# Patient Record
Sex: Female | Born: 1950 | Race: Black or African American | Hispanic: No | Smoking: Never smoker
Health system: Southern US, Community
[De-identification: ages and names within clinical notes are randomized; demographics above are authoritative.]

## PROBLEM LIST (undated history)

## (undated) ENCOUNTER — Ambulatory Visit
Payer: MEDICARE | Attending: Student in an Organized Health Care Education/Training Program | Primary: Student in an Organized Health Care Education/Training Program

## (undated) ENCOUNTER — Ambulatory Visit

## (undated) ENCOUNTER — Ambulatory Visit: Payer: MEDICARE

## (undated) ENCOUNTER — Encounter

## (undated) ENCOUNTER — Telehealth

## (undated) ENCOUNTER — Ambulatory Visit: Payer: MEDICARE | Attending: Adult Health | Primary: Adult Health

## (undated) ENCOUNTER — Telehealth
Attending: Student in an Organized Health Care Education/Training Program | Primary: Student in an Organized Health Care Education/Training Program

## (undated) ENCOUNTER — Encounter
Attending: Student in an Organized Health Care Education/Training Program | Primary: Student in an Organized Health Care Education/Training Program

## (undated) ENCOUNTER — Non-Acute Institutional Stay: Payer: MEDICARE

## (undated) ENCOUNTER — Ambulatory Visit: Attending: Clinical | Primary: Clinical

## (undated) ENCOUNTER — Ambulatory Visit: Payer: Medicare (Managed Care)

## (undated) ENCOUNTER — Encounter
Payer: MEDICARE | Attending: Student in an Organized Health Care Education/Training Program | Primary: Student in an Organized Health Care Education/Training Program

## (undated) ENCOUNTER — Inpatient Hospital Stay: Payer: Medicare (Managed Care)

## (undated) ENCOUNTER — Telehealth: Attending: Family Medicine | Primary: Family Medicine

## (undated) ENCOUNTER — Encounter: Attending: Clinical | Primary: Clinical

## (undated) ENCOUNTER — Other Ambulatory Visit: Attending: Clinical | Primary: Clinical

## (undated) ENCOUNTER — Ambulatory Visit: Payer: MEDICARE | Attending: Pharmacist | Primary: Pharmacist

## (undated) ENCOUNTER — Encounter: Attending: Diagnostic Radiology | Primary: Diagnostic Radiology

## (undated) ENCOUNTER — Encounter: Attending: Family Medicine | Primary: Family Medicine

## (undated) ENCOUNTER — Inpatient Hospital Stay

## (undated) DIAGNOSIS — F0390 Unspecified dementia without behavioral disturbance: Secondary | ICD-10-CM

## (undated) DIAGNOSIS — I10 Essential (primary) hypertension: Secondary | ICD-10-CM

---

## 2015-09-05 ENCOUNTER — Emergency Department: Payer: Self-pay

## 2015-09-05 ENCOUNTER — Encounter: Payer: Self-pay | Admitting: *Deleted

## 2015-09-05 ENCOUNTER — Emergency Department
Admission: EM | Admit: 2015-09-05 | Discharge: 2015-09-05 | Disposition: A | Discharge status: Home / Self Care | Payer: Self-pay | Attending: Emergency Medicine | Admitting: Emergency Medicine

## 2015-09-05 DIAGNOSIS — R252 Cramp and spasm: Secondary | ICD-10-CM

## 2015-09-05 DIAGNOSIS — E876 Hypokalemia: Secondary | ICD-10-CM

## 2015-09-05 DIAGNOSIS — R600 Localized edema: Secondary | ICD-10-CM | POA: Insufficient documentation

## 2015-09-05 LAB — CBC
HCT: 37.3 % (ref 35.0–47.0)
Hemoglobin: 12.6 g/dL (ref 12.0–16.0)
MCH: 28.2 pg (ref 26.0–34.0)
MCHC: 33.8 g/dL (ref 32.0–36.0)
MCV: 83.4 fL (ref 80.0–100.0)
Platelets: 361 10*3/uL (ref 150–440)
RBC: 4.47 MIL/uL (ref 3.80–5.20)
RDW: 14.9 % — AB (ref 11.5–14.5)
WBC: 8.4 10*3/uL (ref 3.6–11.0)

## 2015-09-05 LAB — BASIC METABOLIC PANEL
Anion gap: 6 (ref 5–15)
BUN: 8 mg/dL (ref 6–20)
CALCIUM: 9 mg/dL (ref 8.9–10.3)
CO2: 23 mmol/L (ref 22–32)
CREATININE: 0.75 mg/dL (ref 0.44–1.00)
Chloride: 110 mmol/L (ref 101–111)
GFR calc Af Amer: >60 mL/min (ref >60)
GLUCOSE: 130 mg/dL — AB (ref 65–99)
Potassium: 3.3 mmol/L — ABNORMAL LOW (ref 3.5–5.1)
Sodium: 139 mmol/L (ref 135–145)

## 2015-09-05 LAB — BRAIN NATRIURETIC PEPTIDE: B NATRIURETIC PEPTIDE 5: 8 pg/mL (ref 0.0–100.0)

## 2015-09-05 LAB — CK: CK TOTAL: 102 U/L (ref 38–234)

## 2015-09-05 MED ORDER — DIAZEPAM 2 MG PO TABS: 2.0000 mg | ORAL_TABLET | Freq: Three times a day (TID) | ORAL | Status: DC | PRN

## 2015-09-05 MED ORDER — POTASSIUM CHLORIDE CRYS ER 20 MEQ PO TBCR
40.0000 meq | EXTENDED_RELEASE_TABLET | Freq: Once | ORAL | Status: AC
  Administered 2015-09-05: 40 meq via ORAL
  Filled 2015-09-05: qty 2

## 2015-09-05 MED ORDER — DIAZEPAM 5 MG/ML IJ SOLN
1.0000 mg | Freq: Once | INTRAMUSCULAR | Status: AC
  Administered 2015-09-05: 1 mg via INTRAVENOUS
  Filled 2015-09-05: qty 2

## 2015-09-05 NOTE — ED Provider Notes (Signed)
[facility] Emergency Department Provider Note   ____________________________________________  Time seen: Approximately 2:21 AM  I have reviewed the triage vital signs and the nursing notes.   HISTORY  Chief Complaint Leg Pain    HPI Alyssa Santiago is a 65 y.o. female who presents to the ED from home via EMS with a chief complaint of left lower leg cramps and pain. Patient had a recent history of hypokalemia for which she took a 60 day supply of potassium supplements. Last supplementation was 3 weeks ago. Denies use of diuretics. States her doctor was evaluating her for left lower leg edema but etiology of okay leaning is unclear. She is also noted a 3 week history of swelling to her left lower leg. Swelling is not painful. This morning she awoke from sleep with cramping in her left calf and lateral lower leg. Denies recent fever, chills, chest pain, shortness of breath, abdominal pain, nausea, vomiting, diarrhea. Denies recent travel or trauma. Nothing makes her symptoms better or worse.   Past medical history Hypokalemia  There are no active problems to display for this patient.   No past surgical history on file.  No current outpatient prescriptions on file.  Allergies Review of patient's allergies indicates no known allergies.  No family history on file.  Social History Social History  Substance Use Topics  . Smoking status: Never Smoker   . Smokeless tobacco: Not on file  . Alcohol Use: No    Review of Systems  Constitutional: No fever/chills. Eyes: No visual changes. ENT: No sore throat. Cardiovascular: Denies chest pain. Respiratory: Denies shortness of breath. Gastrointestinal: No abdominal pain.  No nausea, no vomiting.  No diarrhea.  No constipation. Genitourinary: Negative for dysuria. Musculoskeletal: Positive for left leg swelling and cramps. Negative for back pain. Skin: Negative for rash. Neurological: Negative for  headaches, focal weakness or numbness.  10-point ROS otherwise negative.  ____________________________________________   PHYSICAL EXAM:  VITAL SIGNS: ED Triage Vitals  Enc Vitals Group     BP 09/05/15 0039 143/85 mmHg     Pulse Rate 09/05/15 0035 105     Resp 09/05/15 0035 20     Temp 09/05/15 0035 98.1 F (36.7 C)     Temp src --      SpO2 09/05/15 0035 99 %     Weight 09/05/15 0035 222 lb (100.699 kg)     Height 09/05/15 0035 5\' 7"  (1.702 m)     Head Cir --      Peak Flow --      Pain Score --      Pain Loc --      Pain Edu? --      Excl. in [place]? --     Constitutional: Alert and oriented. Well appearing and in no acute distress. Eyes: Conjunctivae are normal. PERRL. EOMI. Head: Atraumatic. Nose: No congestion/rhinnorhea. Mouth/Throat: Mucous membranes are moist.  Oropharynx non-erythematous. Neck: No stridor.   Cardiovascular: Normal rate, regular rhythm. Grossly normal heart sounds.  Good peripheral circulation. Respiratory: Normal respiratory effort.  No retractions. Lungs CTAB. Gastrointestinal: Soft and nontender. No distention. No abdominal bruits. No CVA tenderness. Musculoskeletal:  LLE: Left calf measures 39 cm. Right calf measures 38 cm. Left calf and lateral leg mildly tender to palpation. 1+ nonpitting edema noted from foot to lower leg. Calf is supple without evidence for compartment syndrome. 2+ femoral, popliteal and distal pulses. Brisk, less than 5 second capillary refill.  No joint effusions. Neurologic:  Normal  speech and language. No gross focal neurologic deficits are appreciated. Skin:  Skin is warm, dry and intact. No rash noted. Psychiatric: Mood and affect are normal. Speech and behavior are normal.  ____________________________________________   LABS (all labs ordered are listed, but only abnormal results are displayed)  Labs Reviewed  BASIC METABOLIC PANEL - Abnormal; Notable for the following:    Potassium 3.3 (*)    Glucose, Bld 130 (*)     All other components within normal limits  CBC - Abnormal; Notable for the following:    RDW 14.9 (*)    All other components within normal limits  CK  BRAIN NATRIURETIC PEPTIDE   ____________________________________________  EKG  ED ECG REPORT I, SUNG,JADE J, the attending physician, personally viewed and interpreted this ECG.   Date: 09/05/2015  EKG Time: 0036  Rate: 104  Rhythm: sinus tachycardia  Axis: Normal  Intervals:none  ST&T Change: Nonspecific  ____________________________________________  RADIOLOGY  LLE Doppler Ultrasound: No evidence of deep venous thrombosis. ____________________________________________   PROCEDURES  Procedure(s) performed: None  Procedures  Critical Care performed: No  ____________________________________________   INITIAL IMPRESSION / ASSESSMENT AND PLAN / ED COURSE  Pertinent labs & imaging results that were available during my care of the patient were reviewed by me and considered in my medical decision making (see chart for details).  65 year old female who presents with LLE cramps and edema. Laboratory results remarkable for mild hypokalemia. Will replete, and obtain Doppler ultrasound of left leg to evaluate for DVT.  ----------------------------------------- 3:58 AM on 09/05/2015 -----------------------------------------  Updated patient of negative ultrasound results. Encourage patient to follow-up with her PCP for repeat potassium check. Prescribed limited quantity of Valium to use as needed for muscle cramps. Strict return precautions given. Patient and family verbalize understanding and agree with plan of care. ____________________________________________   FINAL CLINICAL IMPRESSION(S) / ED DIAGNOSES  Final diagnoses:  Hypokalemia  Cramps of left lower extremity      NEW MEDICATIONS STARTED DURING THIS VISIT:  New Prescriptions   No medications on file     Note:  This document was prepared using  Dragon voice recognition software and may include unintentional dictation errors.    Paulette Blanch, MD 09/05/15 [phone]

## 2015-09-05 NOTE — ED Notes (Signed)
Pt brought in via ems from home with leg cramps and pain while sleeping.  Pt states she finished 30 day supply of potassium 2 weeks ago.  No chest pain or sob.  No n/v/d.   Pt alert.  nsr on monitor.

## 2015-09-05 NOTE — ED Notes (Signed)
meds given.  Family with pt.  

## 2015-09-05 NOTE — ED Notes (Signed)
Pt reports she was asleep and woke up with leg cramps.  Finished potassium 2 weeks ago.  Pt reports pain in both legs.  Pt alert.

## 2015-09-05 NOTE — ED Notes (Signed)
Reviewed d/c instructions, follow-up care, and prescription with pt. Pt verbalized understanding 

## 2015-09-05 NOTE — Discharge Instructions (Signed)
1. You may take muscle relaxer as needed for leg cramps (Valium #15). 2. Please follow up with your doctor next week to recheck potassium level. 3. Return to the ER for worsening symptoms, persistent vomiting, difficulty breathing or other concerns.  Leg Cramps Leg cramps occur when a muscle or muscles tighten and you have no control over this tightening (involuntary muscle contraction). Muscle cramps can develop in any muscle, but the most common place is in the calf muscles of the leg. Those cramps can occur during exercise or when you are at rest. Leg cramps are painful, and they may last for a few seconds to a few minutes. Cramps may return several times before they finally stop. Usually, leg cramps are not caused by a serious medical problem. In many cases, the cause is not known. Some common causes include:  Overexertion.  Overuse from repetitive motions, or doing the same thing over and over.  Remaining in a certain position for a long period of time.  Improper preparation, form, or technique while performing a sport or an activity.  Dehydration.  Injury.  Side effects of some medicines.  Abnormally low levels of the salts and ions in your blood (electrolytes), especially potassium and calcium. These levels could be low if you are taking water pills (diuretics) or if you are pregnant. HOME CARE INSTRUCTIONS Watch your condition for any changes. Taking the following actions may help to lessen any discomfort that you are feeling:  Stay well-hydrated. Drink enough fluid to keep your urine clear or pale yellow.  Try massaging, stretching, and relaxing the affected muscle. Do this for several minutes at a time.  For tight or tense muscles, use a warm towel, heating pad, or hot shower water directed to the affected area.  If you are sore or have pain after a cramp, applying ice to the affected area may relieve discomfort.  Put ice in a plastic bag.  Place a towel between your  skin and the bag.  Leave the ice on for 20 minutes, 2-3 times per day.  Avoid strenuous exercise for several days if you have been having frequent leg cramps.  Make sure that your diet includes the essential minerals for your muscles to work normally.  Take medicines only as directed by your health care provider. SEEK MEDICAL CARE IF:  Your leg cramps get more severe or more frequent, or they do not improve over time.  Your foot becomes cold, numb, or blue.   This information is not intended to replace advice given to you by your health care provider. Make sure you discuss any questions you have with your health care provider.   Document Released: 03/28/2004 Document Revised: 07/05/2014 Document Reviewed: 01/26/2014 Elsevier Interactive Patient Education 2016 [employer].  Hypokalemia Hypokalemia means that the amount of potassium in the blood is lower than normal.Potassium is a chemical, called an electrolyte, that helps regulate the amount of fluid in the body. It also stimulates muscle contraction and helps nerves function properly.Most of the body's potassium is inside of cells, and only a very small amount is in the blood. Because the amount in the blood is so small, minor changes can be life-threatening. CAUSES  Antibiotics.  Diarrhea or vomiting.  Using laxatives too much, which can cause diarrhea.  Chronic kidney disease.  Water pills (diuretics).  Eating disorders (bulimia).  Low magnesium level.  Sweating a lot. SIGNS AND SYMPTOMS  Weakness.  Constipation.  Fatigue.  Muscle cramps.  Mental confusion.  Skipped  heartbeats or irregular heartbeat (palpitations).  Tingling or numbness. DIAGNOSIS  Your health care provider can diagnose hypokalemia with blood tests. In addition to checking your potassium level, your health care provider may also check other lab tests. TREATMENT Hypokalemia can be treated with potassium supplements taken by mouth or  adjustments in your current medicines. If your potassium level is very low, you may need to get potassium through a vein (IV) and be monitored in the hospital. A diet high in potassium is also helpful. Foods high in potassium are:  Nuts, such as peanuts and pistachios.  Seeds, such as sunflower seeds and pumpkin seeds.  Peas, lentils, and lima beans.  Whole grain and bran cereals and breads.  Fresh fruit and vegetables, such as apricots, avocado, bananas, cantaloupe, kiwi, oranges, tomatoes, asparagus, and potatoes.  Orange and tomato juices.  Red meats.  Fruit yogurt. HOME CARE INSTRUCTIONS  Take all medicines as prescribed by your health care provider.  Maintain a healthy diet by including nutritious food, such as fruits, vegetables, nuts, whole grains, and lean meats.  If you are taking a laxative, be sure to follow the directions on the label. SEEK MEDICAL CARE IF:  Your weakness gets worse.  You feel your heart pounding or racing.  You are vomiting or having diarrhea.  You are diabetic and having trouble keeping your blood glucose in the normal range. SEEK IMMEDIATE MEDICAL CARE IF:  You have chest pain, shortness of breath, or dizziness.  You are vomiting or having diarrhea for more than 2 days.  You faint. MAKE SURE YOU:   Understand these instructions.  Will watch your condition.  Will get help right away if you are not doing well or get worse.   This information is not intended to replace advice given to you by your health care provider. Make sure you discuss any questions you have with your health care provider.   Document Released: 02/18/2005 Document Revised: 03/11/2014 Document Reviewed: 08/21/2012 Elsevier Interactive Patient Education 2016 [place].  Potassium Content of Foods Potassium is a mineral found in many foods and drinks. It helps keep fluids and minerals balanced in your body and affects how steadily your heart beats. Potassium also  helps control your blood pressure and keep your muscles and nervous system healthy. Certain health conditions and medicines may change the balance of potassium in your body. When this happens, you can help balance your level of potassium through the foods that you do or do not eat. Your health care provider or dietitian may recommend an amount of potassium that you should have each day. The following lists of foods provide the amount of potassium (in parentheses) per serving in each item. HIGH IN POTASSIUM  The following foods and beverages have 200 mg or more of potassium per serving:  Apricots, 2 raw or 5 dry (200 mg).  Artichoke, 1 medium (345 mg).  Avocado, raw,  each (245 mg).  Banana, 1 medium (425 mg).  Beans, lima, or baked beans, canned,  cup (280 mg).  Beans, white, canned,  cup (595 mg).  Beef roast, 3 oz (320 mg).  Beef, ground, 3 oz (270 mg).  Beets, raw or cooked,  cup (260 mg).  Bran muffin, 2 oz (300 mg).  Broccoli,  cup (230 mg).  Brussels sprouts,  cup (250 mg).  Cantaloupe,  cup (215 mg).  Cereal, 100% bran,  cup (200-400 mg).  Cheeseburger, single, fast food, 1 each (225-400 mg).  Chicken, 3 oz (220 mg).  Clams, canned, 3 oz (535 mg).  Crab, 3 oz (225 mg).  Dates, 5 each (270 mg).  Dried beans and peas,  cup (300-475 mg).  Figs, dried, 2 each (260 mg).  Fish: halibut, tuna, cod, snapper, 3 oz (480 mg).  Fish: salmon, haddock, swordfish, perch, 3 oz (300 mg).  Fish, tuna, canned 3 oz (200 mg).  [place] fries, fast food, 3 oz (470 mg).  Granola with fruit and nuts,  cup (200 mg).  Grapefruit juice,  cup (200 mg).  Greens, beet,  cup (655 mg).  Honeydew melon,  cup (200 mg).  Kale, raw, 1 cup (300 mg).  Kiwi, 1 medium (240 mg).  Kohlrabi, rutabaga, parsnips,  cup (280 mg).  Lentils,  cup (365 mg).  Mango, 1 each (325 mg).  Milk, chocolate, 1 cup (420 mg).  Milk: nonfat, low-fat, whole, buttermilk, 1 cup  (350-380 mg).  Molasses, 1 Tbsp (295 mg).  Mushrooms,  cup (280) mg.  Nectarine, 1 each (275 mg).  Nuts: almonds, peanuts, hazelnuts, [place], cashew, mixed, 1 oz (200 mg).  Nuts, pistachios, 1 oz (295 mg).  Orange, 1 each (240 mg).  Orange juice,  cup (235 mg).  Papaya, medium,  fruit (390 mg).  Peanut butter, chunky, 2 Tbsp (240 mg).  Peanut butter, smooth, 2 Tbsp (210 mg).  Pear, 1 medium (200 mg).  Pomegranate, 1 whole (400 mg).  Pomegranate juice,  cup (215 mg).  Pork, 3 oz (350 mg).  Potato chips, salted, 1 oz (465 mg).  Potato, baked with skin, 1 medium (925 mg).  Potatoes, boiled,  cup (255 mg).  Potatoes, mashed,  cup (330 mg).  Prune juice,  cup (370 mg).  Prunes, 5 each (305 mg).  Pudding, chocolate,  cup (230 mg).  Pumpkin, canned,  cup (250 mg).  Raisins, seedless,  cup (270 mg).  Seeds, sunflower or pumpkin, 1 oz (240 mg).  Soy milk, 1 cup (300 mg).  Spinach,  cup (420 mg).  Spinach, canned,  cup (370 mg).  Sweet potato, baked with skin, 1 medium (450 mg).  Swiss chard,  cup (480 mg).  Tomato or vegetable juice,  cup (275 mg).  Tomato sauce or puree,  cup (400-550 mg).  Tomato, raw, 1 medium (290 mg).  Tomatoes, canned,  cup (200-300 mg).  [place], 3 oz (250 mg).  Wheat germ, 1 oz (250 mg).  Winter squash,  cup (250 mg).  Yogurt, plain or fruited, 6 oz (260-435 mg).  Zucchini,  cup (220 mg). MODERATE IN POTASSIUM The following foods and beverages have 50-200 mg of potassium per serving:  Apple, 1 each (150 mg).  Apple juice,  cup (150 mg).  Applesauce,  cup (90 mg).  Apricot nectar,  cup (140 mg).  Asparagus, small spears,  cup or 6 spears (155 mg).  Bagel, cinnamon raisin, 1 each (130 mg).  Bagel, egg or plain, 4 in., 1 each (70 mg).  Beans, green,  cup (90 mg).  Beans, yellow,  cup (190 mg).  Beer, regular, 12 oz (100 mg).  Beets, canned,  cup (125 mg).  Blackberries,  cup  (115 mg).  Blueberries,  cup (60 mg).  Bread, whole wheat, 1 slice (70 mg).  Broccoli, raw,  cup (145 mg).  Cabbage,  cup (150 mg).  Carrots, cooked or raw,  cup (180 mg).  Cauliflower, raw,  cup (150 mg).  Celery, raw,  cup (155 mg).  Cereal, bran flakes, cup (120-150 mg).  Cheese, cottage,  cup (110 mg).  Cherries, 10 each (150 mg).  Chocolate, 1 oz bar (165 mg).  Coffee, brewed 6 oz (90 mg).  Corn,  cup or 1 ear (195 mg).  Cucumbers,  cup (80 mg).  Egg, large, 1 each (60 mg).  Eggplant,  cup (60 mg).  Endive, raw, cup (80 mg).  English muffin, 1 each (65 mg).  Fish, orange roughy, 3 oz (150 mg).  Frankfurter, beef or pork, 1 each (75 mg).  Fruit cocktail,  cup (115 mg).  Grape juice,  cup (170 mg).  Grapefruit,  fruit (175 mg).  Grapes,  cup (155 mg).  Greens: kale, turnip, collard,  cup (110-150 mg).  Ice cream or frozen yogurt, chocolate,  cup (175 mg).  Ice cream or frozen yogurt, vanilla,  cup (120-150 mg).  Lemons, limes, 1 each (80 mg).  Lettuce, all types, 1 cup (100 mg).  Mixed vegetables,  cup (150 mg).  Mushrooms, raw,  cup (110 mg).  Nuts: walnuts, pecans, or macadamia, 1 oz (125 mg).  Oatmeal,  cup (80 mg).  Okra,  cup (110 mg).  Onions, raw,  cup (120 mg).  Peach, 1 each (185 mg).  Peaches, canned,  cup (120 mg).  Pears, canned,  cup (120 mg).  Peas, green, frozen,  cup (90 mg).  Peppers, green,  cup (130 mg).  Peppers, red,  cup (160 mg).  Pineapple juice,  cup (165 mg).  Pineapple, fresh or canned,  cup (100 mg).  Plums, 1 each (105 mg).  Pudding, vanilla,  cup (150 mg).  Raspberries,  cup (90 mg).  Rhubarb,  cup (115 mg).  Rice, wild,  cup (80 mg).  Shrimp, 3 oz (155 mg).  Spinach, raw, 1 cup (170 mg).  Strawberries,  cup (125 mg).  Summer squash  cup (175-200 mg).  Swiss chard, raw, 1 cup (135 mg).  Tangerines, 1 each (140 mg).  Tea, brewed, 6 oz (65  mg).  Turnips,  cup (140 mg).  Watermelon,  cup (85 mg).  Wine, red, table, 5 oz (180 mg).  Wine, white, table, 5 oz (100 mg). LOW IN POTASSIUM The following foods and beverages have less than 50 mg of potassium per serving.  Bread, white, 1 slice (30 mg).  Carbonated beverages, 12 oz (less than 5 mg).  Cheese, 1 oz (20-30 mg).  Cranberries,  cup (45 mg).  Cranberry juice cocktail,  cup (20 mg).  Fats and oils, 1 Tbsp (less than 5 mg).  Hummus, 1 Tbsp (32 mg).  Nectar: papaya, mango, or pear,  cup (35 mg).  Rice, white or brown,  cup (50 mg).  Spaghetti or macaroni,  cup cooked (30 mg).  Tortilla, flour or corn, 1 each (50 mg).  Waffle, 4 in., 1 each (50 mg).  Water chestnuts,  cup (40 mg).   This information is not intended to replace advice given to you by your health care provider. Make sure you discuss any questions you have with your health care provider.   Document Released: 10/02/2004 Document Revised: 02/23/2013 Document Reviewed: 01/15/2013 Elsevier Interactive Patient Education [employer].

## 2016-11-07 ENCOUNTER — Other Ambulatory Visit: Payer: Self-pay | Admitting: Pediatrics

## 2016-11-07 DIAGNOSIS — Z1231 Encounter for screening mammogram for malignant neoplasm of breast: Secondary | ICD-10-CM

## 2017-11-07 ENCOUNTER — Other Ambulatory Visit: Payer: Self-pay | Admitting: Neurology

## 2017-11-07 DIAGNOSIS — R413 Other amnesia: Secondary | ICD-10-CM

## 2017-11-21 ENCOUNTER — Ambulatory Visit
Admission: RE | Admit: 2017-11-21 | Discharge: 2017-11-21 | Disposition: A | Discharge status: Home / Self Care | Payer: Medicare Other | Source: Ambulatory Visit | Attending: Neurology | Admitting: Neurology

## 2017-11-21 DIAGNOSIS — G319 Degenerative disease of nervous system, unspecified: Secondary | ICD-10-CM | POA: Insufficient documentation

## 2017-11-21 DIAGNOSIS — R413 Other amnesia: Secondary | ICD-10-CM | POA: Insufficient documentation

## 2017-11-21 DIAGNOSIS — R9082 White matter disease, unspecified: Secondary | ICD-10-CM | POA: Diagnosis not present

## 2018-02-09 ENCOUNTER — Other Ambulatory Visit: Payer: Self-pay | Admitting: Neurology

## 2018-02-09 DIAGNOSIS — E237 Disorder of pituitary gland, unspecified: Secondary | ICD-10-CM

## 2018-02-18 ENCOUNTER — Ambulatory Visit: Payer: Medicare Other

## 2018-03-10 ENCOUNTER — Encounter (INDEPENDENT_AMBULATORY_CARE_PROVIDER_SITE_OTHER): Payer: Self-pay

## 2018-03-10 ENCOUNTER — Ambulatory Visit
Admission: RE | Admit: 2018-03-10 | Discharge: 2018-03-10 | Disposition: A | Discharge status: Home / Self Care | Payer: Medicare Other | Source: Ambulatory Visit | Attending: Neurology | Admitting: Neurology

## 2018-03-10 DIAGNOSIS — E237 Disorder of pituitary gland, unspecified: Secondary | ICD-10-CM | POA: Insufficient documentation

## 2018-03-10 LAB — POCT I-STAT CREATININE: Creatinine, Ser: 0.8 mg/dL (ref 0.44–1.00)

## 2018-03-10 MED ORDER — GADOBUTROL 1 MMOL/ML IV SOLN
7.5000 mL | Freq: Once | INTRAVENOUS | Status: AC | PRN
Start: 2018-03-10 — End: 2018-03-10
  Administered 2018-03-10: 7.5 mL via INTRAVENOUS

## 2018-05-15 ENCOUNTER — Ambulatory Visit: Payer: Medicare Other

## 2018-05-19 ENCOUNTER — Encounter: Payer: Medicare Other | Admitting: Physical Therapy

## 2018-05-20 ENCOUNTER — Ambulatory Visit: Payer: Medicare Other | Admitting: Physical Therapy

## 2018-08-14 ENCOUNTER — Other Ambulatory Visit: Payer: Self-pay

## 2018-08-14 ENCOUNTER — Ambulatory Visit: Payer: Medicare Other | Attending: Neurology

## 2018-08-14 DIAGNOSIS — R2681 Unsteadiness on feet: Secondary | ICD-10-CM | POA: Diagnosis present

## 2018-08-14 DIAGNOSIS — R42 Dizziness and giddiness: Secondary | ICD-10-CM | POA: Insufficient documentation

## 2018-08-14 NOTE — Therapy (Signed)
[place] MAIN [facility] SERVICES [address] [place], [place], [address] Phone: [phone]   Fax:  [phone]  Physical Therapy Evaluation  Patient Details  Name: Alyssa Santiago MRN: [id number] Date of Birth: 10/12/1950 Referring Provider (PT): Dr. Manuella Ghazi   Encounter Date: 08/14/2018  PT End of Session - 08/14/18 1014    Visit Number  1    Number of Visits  13    Date for PT Re-Evaluation  11/06/18    PT Start Time  8127    PT Stop Time  1015    PT Time Calculation (min)  80 min    Equipment Utilized During Treatment  Gait belt    Activity Tolerance  Patient tolerated treatment well    Behavior During Therapy  [facility] for tasks assessed/performed       History reviewed. No pertinent past medical history.  History reviewed. No pertinent surgical history.  Vitals:   08/14/18 0921 08/14/18 0922 08/14/18 0923  SpO2: 98% 99% 99%     Subjective Assessment - 08/14/18 0924    Subjective  Dizziness/weakness/unsteadiness    Pertinent History  Alyssa Santiago was referred for vestibular therapy due to complaints of dizziness. 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 Otherwise MRI negative for other acute changes. It appears the MRI was ordered during work-up of symptoms consisting of headache, dizziness, memory difficulty. She did have a laboratory evaluation which revealed normal findings for hormone production. She was also complaining of some blurry vision and recently saw ophthalmology 3 months and they did not comment on any visual field defects. She saw cardiology with a halter monitor which was only significant for some bradycardia in the 30's but this was attributed to normal variation during sleep. She also has been having daily headaches and was prescribed migraine medication by neurology. Pt complains of dizziness which started at some point in the last year. Pt  reports that she feels like she is losing her balance and her "eyes feel funny." She also complains of severe fatigue and is "dragging" when she wakes up. Pt states that she feels like she might pass out or black out but these symptoms have improved since she started taking the Fioricet for her headaches. She was prescribed Emgality which pt states was $1000/month so she was unable to fill. She did not fill any of there other migraine medications she was prescribed.    Limitations  Walking    Diagnostic tests  MRI: small pituitary tumor but otherwise no acute changes    Patient Stated Goals  "For exercising"    Currently in Pain?  No/denies         [facility] PT Assessment - 08/14/18 0925      Assessment   Medical Diagnosis  Dizziness    Referring Provider (PT)  Dr. Manuella Ghazi    Onset Date/Surgical Date  08/13/17   Approximate   Hand Dominance  Right    Next MD Visit  July 2020    Prior Therapy  "Yes 5-6 years ago" Pt doesn't recall why      Precautions   Precautions  Fall      Restrictions   Weight Bearing Restrictions  No      Balance Screen   Has the patient fallen in the past 6 months  No    Has the patient had a decrease in activity level because of a fear of falling?  Yes    Is the patient reluctant to leave their home because of a fear of falling?   Yes      [place] residence    Living Arrangements  Alone    Available Help at Discharge  Family    Type of [place] to enter    Entrance Stairs-Number of Steps  3    Entrance Stairs-Rails  Right    [place]  One level    [place]  None      Prior Function   Level of [place]  Retired    Leisure  "Nothing" Pt states that she feels down often      Cognition   Overall Cognitive Status  No family/caregiver present to determine baseline cognitive functioning      Observation/Other Assessments   Other Surveys   Activities of  Balance and Confidence Scale;Dizziness Handicap Inventory ([place])    Activities of Balance Confidence Scale (ABC Scale)   58.75%    Dizziness Handicap Inventory (DHI)   40/100      Standardized Balance Assessment   Standardized Balance Assessment  Berg Balance Test;Dynamic Gait Index;Timed Up and Go Test;Five Times Sit to Stand;10 meter walk test    Five times sit to stand comments   14.4s    10 Meter Walk  Self-selected: 9.7s = 1.51m/s; Fastest: 5.3s = 1.89 m/s      Berg Balance Test   Sit to Stand  Able to stand without using hands and stabilize independently    Standing Unsupported  Able to stand safely 2 minutes    Sitting with Back Unsupported but Feet Supported on Floor or Stool  Able to sit safely and securely 2 minutes    Stand to Sit  Sits safely with minimal use of hands    Transfers  Able to transfer safely, minor use of hands    Standing Unsupported with Eyes Closed  Able to stand 10 seconds safely    Standing Unsupported with Feet Together  Able to place feet together independently and stand 1 minute safely    From Standing, Reach Forward with Outstretched Arm  Can reach confidently >25 cm (10")    From Standing Position, Pick up Object from Floor  Able to pick up shoe safely and easily    From Standing Position, Turn to Look Behind Over each Shoulder  Looks behind from both sides and weight shifts well    Turn 360 Degrees  Able to turn 360 degrees safely in 4 seconds or less    Standing Unsupported, Alternately Place Feet on Step/Stool  Able to stand independently and safely and complete 8 steps in 20 seconds    Standing Unsupported, One Foot in Front  Able to place foot tandem independently and hold 30 seconds    Standing on One Leg  Able to lift leg independently and hold > 10 seconds    Total Score  56      Dynamic Gait Index   Level Surface  Normal    Change in Gait Speed  Normal    Gait with Horizontal Head Turns  Normal    Gait with Vertical Head Turns  Moderate  Impairment    Gait and Pivot Turn  Normal    Step Over Obstacle  Normal    Step Around Obstacles  Normal    Steps  Mild Impairment  Total Score  21      Timed Up and Go Test   TUG  Normal TUG    Normal TUG (seconds)  7              VESTIBULAR AND BALANCE EVALUATION   HISTORY:  Subjective history of current problem: Alyssa Santiago was referred for vestibular therapy due to complaints of dizziness. 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 Otherwise MRI negative for other acute changes. It appears the MRI was ordered during work-up of symptoms consisting of headache, dizziness, memory difficulty. She did have a laboratory evaluation which revealed normal findings for hormone production. She was also complaining of some blurry vision and recently saw ophthalmology 3 months and they did not comment on any visual field defects. She saw cardiology with a halter monitor which was only significant for some bradycardia in the 30's but this was attributed to normal variation during sleep. She also has been having daily headaches and was prescribed migraine medication by neurology. Pt complains of dizziness which started at some point in the last year. Pt reports that she feels like she is losing her balance and her "eyes feel funny." She also complains of severe fatigue and is "dragging" when she wakes up. Pt states that she feels like she might pass out or black out but these symptoms have improved since she started taking the Fioricet for her headaches. She was prescribed Emgality which pt states was $1000/month so she was unable to fill. She did not fill any of there other migraine medications she was prescribed.    Description of dizziness: (vertigo, unsteadiness, lightheadedness, falling, general unsteadiness, whoozy, swimmy-headed sensation, aural fullness) Unsteadiness, whoozy, lightheadedness Frequency: No more than  twice per day, worse in the morning  Duration: Less than 20 minutes to 3 hours Symptom nature: (motion provoked, positional, spontaneous, constant, variable, intermittent) Unknown  Provocative Factors: Unknown Easing Factors: Unknown  Progression of symptoms: (better, worse, no change since onset) Better History of similar episodes: None  Falls (yes/no): No Number of falls in past 6 months: No  Prior Functional Level: Independent  Auditory complaints (tinnitus, pain, drainage): Denies Vision (last eye exam, diplopia, recent changes): Bilateral blurry vision. Pt wears reading/driving glasses.   Red Flags: (dysarthria, dysphagia, drop attacks, bowel and bladder changes, recent weight loss/gain) Review of systems negative for red flags.     EXAMINATION  POSTURE:   NEUROLOGICAL SCREEN: (2+ unless otherwise noted.) N=normal  Ab=abnormal  Level Dermatome R L Myotome R L Reflex R L  C3 Anterior Neck N N Sidebend C2-3 N N Jaw CN V    C4 Top of Shoulder N N Shoulder Shrug C4 N N Hoffmans UMN    C5 Lateral Upper Arm N N Shoulder ABD C4-5 N N Biceps C5-6    C6 Lateral Arm/ Thumb N N Arm Flex/ Wrist Ext C5-6 N N Brachiorad. C5-6    C7 Middle Finger N N Arm Ext//Wrist Flex C6-7 N N Triceps C7    C8 4th & 5th Finger N N Flex/ Ext Carpi Ulnaris C8 N N Patellar (L3-4)    T1 Medial Arm N N Interossei T1 N N Gastrocnemius    L2 Medial thigh/groin N N Illiopsoas (L2-3) N N     L3 Lower thigh/med.knee N N Quadriceps (L3-4) N N     L4 Medial leg/lat thigh N N Tibialis Ant (L4-5) N N     L5  Lat. leg & dorsal foot N N EHL (L5) N N     S1 post/lat foot/thigh/leg N N Gastrocnemius (S1-2) N N     S2 Post./med. thigh & leg N N Hamstrings (L4-S3) N N       SOMATOSENSORY:         Sensation           Intact      Diminished         Absent  Light touch Normal      COORDINATION: Finger to Nose: Normal Heel to Shin: Normal  MUSCULOSKELETAL SCREEN: Cervical Spine ROM: WFL and painless in all  planes. No gross deficits identified   ROM: WFL  MMT: WFL  Functional Mobility: Independent with transfers and ambulation without assistive device    POSTURAL CONTROL TESTS:   Clinical Test of Sensory Interaction for Balance    (CTSIB):  CONDITION TIME STRATEGY SWAY  Eyes open, firm surface 30 seconds ankle 1+  Eyes closed, firm surface 30 seconds ankle 2+  Eyes open, foam surface 30 seconds ankle 2+  Eyes closed, foam surface 14.8 seconds ankle 4+    OCULOMOTOR / VESTIBULAR TESTING:  Oculomotor Exam- Room Light  Findings Comments  Ocular Alignment normal   Ocular ROM normal   Spontaneous Nystagmus normal   End-Gaze Nystagmus normal   Smooth Pursuit normal   Saccades normal   VOR abnormal Pt reports dizziness  VOR Cancellation abnormal Pt reports dizziness, no corrective saccades noted  Left Head Thrust normal   Right Head Thrust normal   Head Shaking Nystagmus not examined   Static Acuity abnormal 20/50, 20/20 with reading glasses  Dynamic Acuity not examined 20/25 (one line loss)    Oculomotor Exam- Fixation Suppressed  Findings Comments  Ocular Alignment not examined   Ocular ROM not examined   Spontaneous Nystagmus not examined   End-Gaze Nystagmus not examined   Head Shaking Nystagmus not examined     BPPV TESTS:  Symptoms Duration Intensity Nystagmus  L Dix-Hallpike None   None  R Dix-Hallpike None   None  L Head Roll None   None  R Head Roll None   None  L Sidelying Test      R Sidelying Test        FUNCTIONAL OUTCOME MEASURES:  Results Comments  DHI 40/100 Significant perception of handicap; in need of intervention  ABC Scale 58.75% Falls risk; in need of intervention  DGI 21/24 Mild impairment, mostly with vertical head turns  5TSTS 14.4s Mild weakness  TUG 7.0s WNL  BERG 56/56 WNL  10 meter Walking Speed Self-selected: 9.7s = 1.85m/s; Fastest: 5.3s = 1.89 m/s WNL          Objective measurements completed on examination: See above  findings.              PT Education - 08/14/18 1014    Education Details  Plan of care, vestibular migraine    Person(s) Educated  Patient    Methods  Explanation;Handout    Comprehension  Verbalized understanding       PT Short Term Goals - 08/14/18 1412      PT SHORT TERM GOAL #1   Title  Pt will be independent with HEP in order to improve strength and balance in order to decrease fall risk and improve function at home and with leisure activities    Time  6    Period  Weeks    Status  New  Target Date  09/25/18        PT Long Term Goals - 08/14/18 1412      PT LONG TERM GOAL #1   Title  Pt will improve DGI by at least 3 points in order to demonstrate clinically significant improvement in balance and decreased risk for falls.    Baseline  08/14/18: 21/24    Time  12    Period  Weeks    Status  New    Target Date  11/06/18      PT LONG TERM GOAL #2   Title  Pt will improve ABC by at least 13% in order to demonstrate clinically significant improvement in balance confidence.    Baseline  08/14/18: 58.75%    Time  12    Period  Weeks    Status  New    Target Date  11/06/18      PT LONG TERM GOAL #3   Title  Pt will decrease 5TSTS by at least 3 seconds in order to demonstrate clinically significant improvement in LE strength.    Baseline  08/14/18: 14.4s    Time  12    Period  Weeks    Status  New    Target Date  11/06/18      PT LONG TERM GOAL #4   Title  Pt will decrease DHI score by at least 18 points in order to demonstrate clinically significant reduction in disability    Baseline  08/14/18: 40/100    Time  12    Period  Weeks    Status  New    Target Date  11/06/18             Plan - 08/14/18 1014    Clinical Impression Statement  Pt is a pleasant 68 year-old female referred by neurology for dizziness. She is not a great historian and has already been evaluated by neurology, neurosurgery, endocrinology, and cardiology. During evaluation  today she reports dizziness with both VOR and VOR cancellation testing. However her head thrust is negative bilaterally and her DVA is only a one line loss which is also negative for vestibular hypofunction. Of note she does have very poor static visual acuity at 20/50 without her reading glasses. All BPPV testing today is negative. Her BERG is 56/56 and she has some higher level balance deficits scoring 21/24 on the DGI. Her overall balance confidence is low scoring 58.75% on the ABC scale. She also rates the disability relating to her dizziness relatively high with a 40/100 on the [facility]. She has some LE weakness as noted by it taking her 14.4 seconds to perform the Five Time Sit to Stand. Gait speed is [facility] for full community mobility. She states that she was treated for depression multiple years ago and endorses feeling depressed at this time. She scored a 17 on the PHQ-9 and denies any suicidal or homicidal ideations. Pt reports daily headaches and has recently been prescribed migraine medication however it appears that she only filled one prescription for Fioricet. However she states that her dizziness has improved since using this medication. There is no clear vestibular involvement in her symptoms however she would benefit from skilled PT services to address general deficits in her balance and weakness. 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 Pt will benefit from skilled PT services to address deficits in balance  and decrease risk for future falls.    Personal Factors and Comorbidities  Age;Comorbidity 1;Time since onset of injury/illness/exacerbation;Fitness;Comorbidity 2;Comorbidity 3+    Comorbidities  headaches, OSA, depression, mild cognitive impairment    Examination-Activity Limitations  Locomotion Level    Examination-Participation Restrictions  Community  Activity;Interpersonal Relationship;Shop;Yard Work    [employer]  Evolving/Moderate complexity    Clinical Decision Making  Moderate    Rehab Potential  Good    PT Frequency  1x / week    PT Duration  12 weeks    PT Treatment/Interventions  ADLs/Self Care Home Management;Aquatic Therapy;Biofeedback;Canalith Repostioning;Cryotherapy;Electrical Stimulation;Iontophoresis 4mg /ml Dexamethasone;Moist Heat;Traction;Ultrasound;DME Instruction;Gait training;Stair training;Functional mobility training;Therapeutic activities;Therapeutic exercise;Balance training;Neuromuscular re-education;Patient/family education;Manual techniques;Passive range of motion;Dry needling;Vestibular    PT Next Visit Plan  Initiate HEP, progress strength and balance (incorporate adaptation and habituation activities), additional migraine education    PT Home Exercise Plan  None currently    Consulted and Agree with Plan of Care  Patient       Patient will benefit from skilled therapeutic intervention in order to improve the following deficits and impairments:  Decreased balance, Dizziness, Decreased strength  Visit Diagnosis: Dizziness and giddiness  Unsteadiness on feet      Problem List There are no active problems to display for this patient.  Lyndel Safe Alyssa Santiago PT, DPT, GCS  Alyssa Santiago 08/14/2018, 2:16 PM  [place] MAIN [facility] SERVICES [address] [place], [place], [address] Phone: [phone]   Fax:  [phone]  Name: Alyssa Santiago MRN: [id number] Date of Birth: 1950-11-08

## 2018-08-21 ENCOUNTER — Ambulatory Visit: Payer: Medicare Other

## 2018-09-11 ENCOUNTER — Ambulatory Visit: Payer: Medicare Other | Attending: Neurology

## 2018-09-11 DIAGNOSIS — R42 Dizziness and giddiness: Secondary | ICD-10-CM | POA: Insufficient documentation

## 2018-09-11 DIAGNOSIS — R2681 Unsteadiness on feet: Secondary | ICD-10-CM | POA: Insufficient documentation

## 2018-09-15 ENCOUNTER — Other Ambulatory Visit: Payer: Self-pay | Admitting: Neurology

## 2018-09-15 DIAGNOSIS — E237 Disorder of pituitary gland, unspecified: Secondary | ICD-10-CM

## 2018-09-18 ENCOUNTER — Ambulatory Visit: Payer: Medicare Other

## 2018-09-28 ENCOUNTER — Other Ambulatory Visit: Payer: Self-pay

## 2018-09-28 ENCOUNTER — Ambulatory Visit: Payer: Medicare Other

## 2018-09-28 DIAGNOSIS — R2681 Unsteadiness on feet: Secondary | ICD-10-CM

## 2018-09-28 DIAGNOSIS — R42 Dizziness and giddiness: Secondary | ICD-10-CM | POA: Diagnosis not present

## 2018-09-28 NOTE — Therapy (Signed)
[place] MAIN [facility] SERVICES [address] [place], [place], [address] Phone: [phone]   Fax:  [phone]  Physical Therapy Treatment/Goal Update  Patient Details  Name: Alyssa Santiago MRN: [id number] Date of Birth: 07/19/1950 Referring Provider (PT): Dr. Manuella Ghazi   Encounter Date: 09/28/2018  PT End of Session - 09/28/18 1034    Visit Number  2    Number of Visits  13    Date for PT Re-Evaluation  11/06/18    Authorization Type  08/14/18 date of initial evaluation    PT Start Time  1024    PT Stop Time  1115    PT Time Calculation (min)  51 min    Equipment Utilized During Treatment  Gait belt    Activity Tolerance  Patient tolerated treatment well    Behavior During Therapy  [facility] for tasks assessed/performed       History reviewed. No pertinent past medical history.  History reviewed. No pertinent surgical history.  Vitals:   09/28/18 1103 09/28/18 1104 09/28/18 1105  SpO2: 99% 100% 99%    Subjective Assessment - 09/28/18 1028    Subjective  Pt reports still experiencing dizziness that occurs 1-2 a week. The dizziness lasts for a couple of minutes at a time and she feels like her eyes are spinning; She reports it not being position dependent and sometimes occuring when she is seated in a chair and not moving. She first experiences pressure and a low grade headache around the temples that then progresses into dizziness. She reports that when she takes the medicine for her headaches the dizziness does improve. Pt reports having an f/u MRI scheduled for tomorrow 09/29/18. Pt reports wanting to continue physical therapy to work on balance and dizziness at this time. She reports experiencing SOB for about two months now that occurs during short and long distances. She saw her PCP and was also prescribed prednisone due to an acute asthma exacerbation. She will finish her prescription tomorrow and believes that it has helped with her breathing.     Pertinent History  Alyssa Santiago was referred for vestibular therapy due to complaints of dizziness. 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 Otherwise MRI negative for other acute changes. It appears the MRI was ordered during work-up of symptoms consisting of headache, dizziness, memory difficulty. She did have a laboratory evaluation which revealed normal findings for hormone production. She was also complaining of some blurry vision and recently saw ophthalmology 3 months and they did not comment on any visual field defects. She saw cardiology with a halter monitor which was only significant for some bradycardia in the 30's but this was attributed to normal variation during sleep. She also has been having daily headaches and was prescribed migraine medication by neurology. Pt complains of dizziness which started at some point in the last year. Pt reports that she feels like she is losing her balance and her "eyes feel funny." She also complains of severe fatigue and is "dragging" when she wakes up. Pt states that she feels like she might pass out or black out but these symptoms have improved since she started taking the Fioricet for her headaches. She was prescribed Emgality which pt states was $1000/month so she was unable to fill. She did not fill any of there other migraine medications she was prescribed.    Limitations  Walking    Diagnostic tests  MRI: small  pituitary tumor but otherwise no acute changes    Patient Stated Goals  "For exercising"    Currently in Pain?  No/denies         TREATMENT   Neuromuscular Re-education   Orthostatic Vitals Supine BP: 151/97 mmHg Pulse: 91 bpm   Sitting BP: 144/97 mmHg  Pulse: 100 bpm  Standing BP: 157/95 Pulse: 106 bpm   Outcome Measures performed with patient including: 5xSTS, DGI, ABC, DHI, modified CTSIB  Outcome Measures Performed:  5xSTS: 11.94s DGI:  23/24 ABC (unbilled time): 91.25% DHI (unbilled time):  62/952 VORx1 2x60s; pt educated on addition of this exercise as a home exercise program   POSTURAL CONTROL TESTS   Modified Clinical Test of Sensory Interaction for Balance (CTSIB):  CONDITION TIME STRATEGY SWAY  Eyes open, firm surface 30 seconds ankle 1+  Eyes closed, firm surface 30 seconds ankle 1+  Eyes open, foam surface 30 seconds ankle 1+  Eyes closed, foam surface 30 seconds ankle 2+    BPPV testing: Dix-Hallpike and Roll Test to the L and to the R negative bilaterally;       Pt educated throughout session about proper posture and technique with exercises. Improved exercise technique, movement at target joints, use of target muscles after min to mod verbal, visual, tactile cues.      Pt presents to physical therapy today after 6 weeks post initial evaluation. She has had numerous appointments in regards to her health having had a GI blockage and SOB. Outcome measures were re-assessed to gain a new baseline for her. She demonstrates improvements in all measures. 5xSTS improved today and is WNL. DGI is 23/24 with patient needing use of rail during descending of steps. ABC increased to 91.25%, and DHI decreased to 24/100. Pt was negative for BPPV testing bilaterally. She did report intermittent bouts of dizziness/light headedness when looking quickly in the horizontal plane throughout treatment session. Her symptoms were minimally provoked during VORx1 exercise and was encouraged to continue exercise seated at home as a home exercise program. Positional vitals were taken today indicating elevated BP, however, no signs of orthostatic hypotension. She reported temporal headache pain and denied signs of true TMJ pain. She does not wish to pursue further PT to address issues related to TMJ pain., However she does wish to continue with physical therapy to address unsteadiness and dizziness. Pt will benefit from PT services to address  deficits in strength, balance, and mobility in order to return to full function at home.       PT Short Term Goals - 09/28/18 1455      PT SHORT TERM GOAL #1   Title  Pt will be independent with HEP in order to improve strength and balance in order to decrease fall risk and improve function at home and with leisure activities    Time  6    Period  Weeks    Status  On-going    Target Date  09/25/18        PT Long Term Goals - 09/28/18 1455      PT LONG TERM GOAL #1   Title  Pt will improve DGI by at least 3 points in order to demonstrate clinically significant improvement in balance and decreased risk for falls.    Baseline  08/14/18: 21/24; 09/28/18 23/24    Time  12    Period  Weeks    Status  Partially Met      PT LONG TERM GOAL #2   Title  Pt will improve ABC by at least 13% in order to demonstrate clinically significant improvement in balance confidence.    Baseline  08/14/18: 58.75%; 09/28/18 91.25%    Time  12    Period  Weeks    Status  Achieved      PT LONG TERM GOAL #3   Title  Pt will decrease 5TSTS by at least 3 seconds in order to demonstrate clinically significant improvement in LE strength.    Baseline  08/14/18: 14.4s; 09/28/18: 11.94s    Time  12    Period  Weeks    Status  Partially Met      PT LONG TERM GOAL #4   Title  Pt will decrease DHI score by at least 18 points in order to demonstrate clinically significant reduction in disability    Baseline  08/14/18: 40/100; 09/28/18: 24/100    Time  12    Period  Weeks    Status  Partially Met            Plan - 09/28/18 1036    Clinical Impression Statement  Pt presents to physical therapy today after 6 weeks post initial evaluation. She has had numerous appointments in regards to her health having had a GI blockage and SOB. Outcome measures were re-assessed to gain a new baseline for her. She demonstrates improvements in all measures 5xSTS to decrease fall risk, DGI needing use of rail during descending  steps, ABC increase to 91.25%, and DHI decrease to 24/100. Pt was negative for canalith repositioning dix-hallpike test bilaterally. She did report intermittent bouts of dizziness/light headedness when looking quickly in the horizontal plane throughout treatment session. Her symptoms were minimally provoked during VORx1 exercise and was encouraged to continue exercise seated at home as a home exercise program. Positional vitals were taken today indicating elevated BP, however, no signs of orthostatic hypotension. She reported temporal headache pain and denied signs of true TMJ pain. She does not wish to pursue further PT to address TMJ, however, does with to continue with physical therapy to address unsteadiness and dizziness. Pt will benefit from PT services to address deficits in strength, balance, and mobility in order to return to full function at home.    Personal Factors and Comorbidities  Age;Comorbidity 1;Time since onset of injury/illness/exacerbation;Fitness;Comorbidity 2;Comorbidity 3+    Comorbidities  headaches, OSA, depression, mild cognitive impairment    Examination-Activity Limitations  Locomotion Level    Examination-Participation Restrictions  Community Activity;Interpersonal Relationship;Shop;Yard Work    [employer]  Evolving/Moderate complexity    Rehab Potential  Good    PT Frequency  1x / week    PT Duration  12 weeks    PT Treatment/Interventions  ADLs/Self Care Home Management;Aquatic Therapy;Biofeedback;Canalith Repostioning;Cryotherapy;Electrical Stimulation;Iontophoresis 22m/ml Dexamethasone;Moist Heat;Traction;Ultrasound;DME Instruction;Gait training;Stair training;Functional mobility training;Therapeutic activities;Therapeutic exercise;Balance training;Neuromuscular re-education;Patient/family education;Manual techniques;Passive range of motion;Dry needling;Vestibular    PT Next Visit Plan  Initiate HEP, progress strength and balance (incorporate  adaptation and habituation activities), additional migraine education    PT Home Exercise Plan  Access Code: [email or web address]   Consulted and Agree with Plan of Care  Patient       Patient will benefit from skilled therapeutic intervention in order to improve the following deficits and impairments:  Decreased balance, Dizziness, Decreased strength  Visit Diagnosis: 1. Dizziness and giddiness   2. Unsteadiness on feet        Problem List There are no active problems to display for this patient.   This entire session  was performed under direct supervision and direction of a licensed Chiropractor . I have personally read, edited and approve of the note as written.    Elmyra Ricks Keyshawn Hellwig SPT Phillips Grout PT, DPT, GCS  Huprich,Jason 09/28/2018, 3:15 PM  [place] MAIN [facility] SERVICES [address] [place], [place], [address] Phone: [phone]   Fax:  [phone]  Name: Alyssa Santiago MRN: [id number] Date of Birth: 04/02/1950

## 2018-09-28 NOTE — Patient Instructions (Signed)
Access Code: [email or web address]  URL: [email or web address]  Date: 09/28/2018  Prepared by: Roxana Hires   Exercises Seated Gaze Stabilization with Head Rotation - 3 reps - 60 seconds hold - 4x daily - 7x weekly

## 2018-09-29 ENCOUNTER — Ambulatory Visit: Admission: RE | Admit: 2018-09-29 | Payer: Medicare Other | Source: Ambulatory Visit

## 2018-10-08 ENCOUNTER — Ambulatory Visit
Admission: RE | Admit: 2018-10-08 | Discharge: 2018-10-08 | Disposition: A | Discharge status: Home / Self Care | Payer: Medicare Other | Source: Ambulatory Visit | Attending: Neurology | Admitting: Neurology

## 2018-10-08 ENCOUNTER — Other Ambulatory Visit: Payer: Self-pay

## 2018-10-08 DIAGNOSIS — E237 Disorder of pituitary gland, unspecified: Secondary | ICD-10-CM | POA: Diagnosis present

## 2018-10-08 LAB — POCT I-STAT CREATININE: Creatinine, Ser: 0.9 mg/dL (ref 0.44–1.00)

## 2018-10-08 MED ORDER — GADOBUTROL 1 MMOL/ML IV SOLN
10.0000 mL | Freq: Once | INTRAVENOUS | Status: AC | PRN
  Administered 2018-10-08: 10 mL via INTRAVENOUS

## 2018-10-13 ENCOUNTER — Encounter: Payer: Medicare Other | Admitting: Physical Therapy

## 2019-02-12 ENCOUNTER — Encounter
Admit: 2019-02-12 | Discharge: 2019-02-13 | Payer: MEDICARE | Attending: Student in an Organized Health Care Education/Training Program | Primary: Student in an Organized Health Care Education/Training Program

## 2019-02-12 MED ORDER — HYDROCHLOROTHIAZIDE 25 MG TABLET
ORAL_TABLET | Freq: Every day | ORAL | 0 refills | 30 days | Status: CP
Start: 2019-02-12 — End: ?

## 2019-02-12 MED ORDER — AMLODIPINE 10 MG TABLET
ORAL_TABLET | Freq: Every day | ORAL | 0 refills | 30 days | Status: CP
Start: 2019-02-12 — End: ?

## 2019-02-12 MED ORDER — LISINOPRIL 40 MG TABLET
ORAL_TABLET | Freq: Every day | ORAL | 0 refills | 90 days | Status: CP
Start: 2019-02-12 — End: ?

## 2019-02-15 ENCOUNTER — Other Ambulatory Visit: Payer: Self-pay | Admitting: Neurosurgery

## 2019-02-15 DIAGNOSIS — D352 Benign neoplasm of pituitary gland: Secondary | ICD-10-CM

## 2019-02-18 ENCOUNTER — Telehealth: Payer: Self-pay | Admitting: *Deleted

## 2019-03-10 ENCOUNTER — Ambulatory Visit
Admission: RE | Admit: 2019-03-10 | Discharge: 2019-03-10 | Disposition: A | Discharge status: Home / Self Care | Payer: Medicare Other | Source: Ambulatory Visit | Attending: Neurosurgery | Admitting: Neurosurgery

## 2019-03-10 ENCOUNTER — Other Ambulatory Visit: Payer: Self-pay

## 2019-03-10 DIAGNOSIS — D352 Benign neoplasm of pituitary gland: Secondary | ICD-10-CM | POA: Diagnosis present

## 2019-03-10 LAB — POCT I-STAT CREATININE: Creatinine, Ser: 0.9 mg/dL (ref 0.44–1.00)

## 2019-03-10 MED ORDER — GADOBUTROL 1 MMOL/ML IV SOLN
10.0000 mL | Freq: Once | INTRAVENOUS | Status: AC | PRN
  Administered 2019-03-10: 14:00:00 10 mL via INTRAVENOUS

## 2019-03-23 ENCOUNTER — Ambulatory Visit
Admit: 2019-03-23 | Discharge: 2019-03-24 | Payer: MEDICARE | Attending: Student in an Organized Health Care Education/Training Program | Primary: Student in an Organized Health Care Education/Training Program

## 2019-03-23 MED ORDER — ATORVASTATIN 40 MG TABLET
ORAL_TABLET | Freq: Every day | ORAL | 3 refills | 30.00000 days | Status: CP
Start: 2019-03-23 — End: 2020-03-22

## 2019-03-23 MED ORDER — LOSARTAN 50 MG TABLET
ORAL_TABLET | Freq: Every day | ORAL | 3 refills | 30.00000 days | Status: CP
Start: 2019-03-23 — End: 2020-03-22

## 2019-03-23 MED ORDER — CHLORTHALIDONE 25 MG TABLET
ORAL_TABLET | Freq: Every morning | ORAL | 3 refills | 30 days | Status: CP
Start: 2019-03-23 — End: 2020-03-22

## 2019-03-23 MED ORDER — AMLODIPINE 10 MG TABLET
ORAL_TABLET | Freq: Every day | ORAL | 3 refills | 30 days | Status: CP
Start: 2019-03-23 — End: ?

## 2019-03-23 MED ORDER — ASPIRIN 81 MG TABLET,DELAYED RELEASE
ORAL_TABLET | Freq: Every day | ORAL | 2 refills | 150.00000 days | Status: CP
Start: 2019-03-23 — End: 2020-03-22

## 2019-04-13 ENCOUNTER — Encounter: Admit: 2019-04-13 | Discharge: 2019-04-14 | Payer: MEDICARE

## 2019-05-05 ENCOUNTER — Encounter
Admit: 2019-05-05 | Discharge: 2019-05-06 | Payer: MEDICARE | Attending: Student in an Organized Health Care Education/Training Program | Primary: Student in an Organized Health Care Education/Training Program

## 2019-05-05 ENCOUNTER — Encounter: Admit: 2019-05-05 | Discharge: 2019-05-06 | Payer: MEDICARE

## 2019-05-05 MED ORDER — CETIRIZINE 10 MG TABLET
ORAL_TABLET | Freq: Every day | ORAL | 0 refills | 30 days | Status: CP
Start: 2019-05-05 — End: 2020-05-04

## 2019-05-05 MED ORDER — PREDNISONE 20 MG TABLET
ORAL_TABLET | Freq: Every day | ORAL | 0 refills | 5 days | Status: CP
Start: 2019-05-05 — End: 2019-05-10

## 2019-05-05 MED ORDER — FLUTICASONE PROPIONATE 50 MCG/ACTUATION NASAL SPRAY,SUSPENSION
Freq: Every day | NASAL | 0 refills | 0 days | Status: CP
Start: 2019-05-05 — End: 2020-05-04

## 2019-05-26 ENCOUNTER — Encounter
Admit: 2019-05-26 | Discharge: 2019-05-27 | Payer: MEDICARE | Attending: Student in an Organized Health Care Education/Training Program | Primary: Student in an Organized Health Care Education/Training Program

## 2019-05-26 DIAGNOSIS — E119 Type 2 diabetes mellitus without complications: Secondary | ICD-10-CM | POA: Insufficient documentation

## 2019-05-26 DIAGNOSIS — R413 Other amnesia: Principal | ICD-10-CM

## 2019-05-26 DIAGNOSIS — R062 Wheezing: Principal | ICD-10-CM

## 2019-05-26 DIAGNOSIS — I10 Essential (primary) hypertension: Principal | ICD-10-CM

## 2019-05-26 DIAGNOSIS — J45909 Unspecified asthma, uncomplicated: Principal | ICD-10-CM

## 2019-05-26 MED ORDER — FLUTICASONE PROPIONATE 110 MCG/ACTUATION HFA AEROSOL INHALER
Freq: Two times a day (BID) | RESPIRATORY_TRACT | 1 refills | 0 days | Status: CP
Start: 2019-05-26 — End: ?

## 2019-05-26 MED ORDER — ALBUTEROL SULFATE HFA 90 MCG/ACTUATION AEROSOL INHALER
2 refills | 0 days | Status: CP
Start: 2019-05-26 — End: ?

## 2019-05-26 MED ORDER — ATORVASTATIN 40 MG TABLET
ORAL_TABLET | Freq: Every day | ORAL | 3 refills | 30.00000 days | Status: CP
Start: 2019-05-26 — End: 2020-05-25

## 2019-05-26 MED ORDER — AMLODIPINE 10 MG TABLET
ORAL_TABLET | Freq: Every day | ORAL | 3 refills | 30 days | Status: CP
Start: 2019-05-26 — End: ?

## 2019-05-26 MED ORDER — CHLORTHALIDONE 25 MG TABLET
ORAL_TABLET | Freq: Every morning | ORAL | 3 refills | 30.00000 days | Status: CP
Start: 2019-05-26 — End: 2020-05-25

## 2019-05-26 MED ORDER — LOSARTAN 50 MG TABLET
ORAL_TABLET | Freq: Every day | ORAL | 3 refills | 30 days | Status: CP
Start: 2019-05-26 — End: 2020-05-25

## 2019-05-26 MED ORDER — METFORMIN 500 MG TABLET
ORAL_TABLET | Freq: Two times a day (BID) | ORAL | 0 refills | 45 days | Status: CP
Start: 2019-05-26 — End: ?

## 2019-05-31 ENCOUNTER — Other Ambulatory Visit: Payer: Self-pay | Admitting: Internal Medicine

## 2019-05-31 DIAGNOSIS — D352 Benign neoplasm of pituitary gland: Secondary | ICD-10-CM

## 2019-06-12 ENCOUNTER — Ambulatory Visit: Payer: Medicare Other

## 2019-06-16 DIAGNOSIS — I10 Essential (primary) hypertension: Principal | ICD-10-CM

## 2019-06-16 MED ORDER — LOSARTAN 50 MG TABLET
ORAL_TABLET | Freq: Every day | ORAL | 1 refills | 90 days | Status: CP
Start: 2019-06-16 — End: 2020-06-15

## 2019-06-24 ENCOUNTER — Ambulatory Visit
Admission: RE | Admit: 2019-06-24 | Discharge: 2019-06-24 | Disposition: A | Discharge status: Home / Self Care | Payer: Medicare Other | Source: Ambulatory Visit | Attending: Internal Medicine | Admitting: Internal Medicine

## 2019-06-24 ENCOUNTER — Other Ambulatory Visit: Payer: Self-pay

## 2019-06-24 DIAGNOSIS — D352 Benign neoplasm of pituitary gland: Secondary | ICD-10-CM | POA: Insufficient documentation

## 2019-06-24 MED ORDER — GADOBUTROL 1 MMOL/ML IV SOLN
10.0000 mL | Freq: Once | INTRAVENOUS | Status: AC | PRN
  Administered 2019-06-24: 10 mL via INTRAVENOUS

## 2019-07-22 ENCOUNTER — Encounter: Admit: 2019-07-22 | Discharge: 2019-07-22 | Disposition: A | Discharge status: Home / Self Care | Payer: MEDICARE

## 2019-07-23 DIAGNOSIS — E119 Type 2 diabetes mellitus without complications: Principal | ICD-10-CM

## 2019-07-23 MED ORDER — METFORMIN 500 MG TABLET
ORAL_TABLET | 0 refills | 0 days | Status: CP
Start: 2019-07-23 — End: ?

## 2019-07-29 ENCOUNTER — Encounter
Admit: 2019-07-29 | Discharge: 2019-07-30 | Payer: MEDICARE | Attending: Student in an Organized Health Care Education/Training Program | Primary: Student in an Organized Health Care Education/Training Program

## 2019-07-29 DIAGNOSIS — J45909 Unspecified asthma, uncomplicated: Principal | ICD-10-CM

## 2019-07-29 DIAGNOSIS — I10 Essential (primary) hypertension: Principal | ICD-10-CM

## 2019-07-29 DIAGNOSIS — E119 Type 2 diabetes mellitus without complications: Principal | ICD-10-CM

## 2019-07-29 DIAGNOSIS — R413 Other amnesia: Principal | ICD-10-CM

## 2019-07-29 MED ORDER — CHLORTHALIDONE 25 MG TABLET
ORAL_TABLET | Freq: Every morning | ORAL | 3 refills | 30 days | Status: CP
Start: 2019-07-29 — End: 2020-07-28

## 2019-07-29 MED ORDER — MONTELUKAST 10 MG TABLET
ORAL_TABLET | Freq: Every day | ORAL | 2 refills | 30.00000 days | Status: CP
Start: 2019-07-29 — End: 2020-07-28

## 2019-07-29 MED ORDER — AMLODIPINE 10 MG TABLET
ORAL_TABLET | Freq: Every day | ORAL | 3 refills | 30 days | Status: CP
Start: 2019-07-29 — End: ?

## 2019-07-29 MED ORDER — DULERA 200 MCG-5 MCG/ACTUATION HFA AEROSOL INHALER
Freq: Two times a day (BID) | RESPIRATORY_TRACT | 1 refills | 22 days | Status: CP
Start: 2019-07-29 — End: ?

## 2019-07-29 MED ORDER — LOSARTAN 50 MG TABLET
ORAL_TABLET | Freq: Every day | ORAL | 1 refills | 90 days | Status: CP
Start: 2019-07-29 — End: 2020-07-28

## 2019-07-29 MED ORDER — ALBUTEROL SULFATE HFA 90 MCG/ACTUATION AEROSOL INHALER
2 refills | 0 days | Status: CP
Start: 2019-07-29 — End: ?

## 2019-08-05 DIAGNOSIS — E119 Type 2 diabetes mellitus without complications: Principal | ICD-10-CM

## 2019-08-05 DIAGNOSIS — I10 Essential (primary) hypertension: Principal | ICD-10-CM

## 2019-08-06 MED ORDER — ATORVASTATIN 40 MG TABLET
ORAL_TABLET | 0 refills | 0 days | Status: CP
Start: 2019-08-06 — End: ?

## 2019-08-09 DIAGNOSIS — E119 Type 2 diabetes mellitus without complications: Principal | ICD-10-CM

## 2019-08-27 ENCOUNTER — Encounter
Admit: 2019-08-27 | Discharge: 2019-08-28 | Payer: MEDICARE | Attending: Student in an Organized Health Care Education/Training Program | Primary: Student in an Organized Health Care Education/Training Program

## 2019-08-27 DIAGNOSIS — J45909 Unspecified asthma, uncomplicated: Principal | ICD-10-CM

## 2019-08-27 DIAGNOSIS — Z1239 Encounter for other screening for malignant neoplasm of breast: Principal | ICD-10-CM

## 2019-08-27 DIAGNOSIS — R0602 Shortness of breath: Principal | ICD-10-CM

## 2019-08-27 DIAGNOSIS — R05 Cough: Principal | ICD-10-CM

## 2019-08-27 DIAGNOSIS — E119 Type 2 diabetes mellitus without complications: Principal | ICD-10-CM

## 2019-08-27 DIAGNOSIS — Z1231 Encounter for screening mammogram for malignant neoplasm of breast: Principal | ICD-10-CM

## 2019-08-27 DIAGNOSIS — R062 Wheezing: Principal | ICD-10-CM

## 2019-09-09 ENCOUNTER — Ambulatory Visit: Admit: 2019-09-09 | Discharge: 2019-09-10 | Payer: MEDICARE

## 2019-09-29 ENCOUNTER — Encounter
Admit: 2019-09-29 | Payer: MEDICARE | Attending: Student in an Organized Health Care Education/Training Program | Primary: Student in an Organized Health Care Education/Training Program

## 2019-09-30 ENCOUNTER — Encounter: Admit: 2019-09-30 | Discharge: 2019-10-01 | Payer: MEDICARE

## 2019-09-30 DIAGNOSIS — E119 Type 2 diabetes mellitus without complications: Principal | ICD-10-CM

## 2019-09-30 DIAGNOSIS — R062 Wheezing: Principal | ICD-10-CM

## 2019-09-30 DIAGNOSIS — I10 Essential (primary) hypertension: Principal | ICD-10-CM

## 2019-09-30 DIAGNOSIS — J45909 Unspecified asthma, uncomplicated: Principal | ICD-10-CM

## 2019-09-30 DIAGNOSIS — R0982 Postnasal drip: Principal | ICD-10-CM

## 2019-09-30 MED ORDER — DULERA 200 MCG-5 MCG/ACTUATION HFA AEROSOL INHALER
Freq: Two times a day (BID) | RESPIRATORY_TRACT | 1 refills | 22 days | Status: CP
Start: 2019-09-30 — End: ?

## 2019-09-30 MED ORDER — LOSARTAN 50 MG TABLET
ORAL_TABLET | Freq: Every day | ORAL | 1 refills | 90 days | Status: CP
Start: 2019-09-30 — End: 2020-09-29

## 2019-09-30 MED ORDER — CHLORTHALIDONE 25 MG TABLET
ORAL_TABLET | Freq: Every morning | ORAL | 3 refills | 30 days | Status: CP
Start: 2019-09-30 — End: 2020-09-29

## 2019-09-30 MED ORDER — ALBUTEROL SULFATE HFA 90 MCG/ACTUATION AEROSOL INHALER
2 refills | 0 days | Status: CP
Start: 2019-09-30 — End: ?

## 2019-09-30 MED ORDER — MONTELUKAST 10 MG TABLET
ORAL_TABLET | Freq: Every day | ORAL | 2 refills | 30 days | Status: CP
Start: 2019-09-30 — End: 2020-09-29

## 2019-09-30 MED ORDER — FLUTICASONE PROPIONATE 50 MCG/ACTUATION NASAL SPRAY,SUSPENSION
Freq: Every day | NASAL | 0 refills | 0 days | Status: CP
Start: 2019-09-30 — End: 2020-09-29

## 2019-10-14 ENCOUNTER — Ambulatory Visit: Admit: 2019-10-14 | Payer: MEDICARE

## 2019-10-21 ENCOUNTER — Ambulatory Visit
Admit: 2019-10-21 | Payer: MEDICARE | Attending: Student in an Organized Health Care Education/Training Program | Primary: Student in an Organized Health Care Education/Training Program

## 2019-11-12 ENCOUNTER — Encounter: Admit: 2019-11-12 | Payer: MEDICARE

## 2019-11-12 DIAGNOSIS — R0602 Shortness of breath: Principal | ICD-10-CM

## 2019-11-15 ENCOUNTER — Ambulatory Visit: Admit: 2019-11-15 | Discharge: 2019-11-15 | Payer: MEDICARE

## 2019-11-15 ENCOUNTER — Encounter: Admit: 2019-11-15 | Discharge: 2019-11-15 | Payer: MEDICARE

## 2019-11-15 DIAGNOSIS — R634 Abnormal weight loss: Principal | ICD-10-CM

## 2019-11-15 DIAGNOSIS — R062 Wheezing: Principal | ICD-10-CM

## 2019-11-15 DIAGNOSIS — R0602 Shortness of breath: Principal | ICD-10-CM

## 2019-11-15 DIAGNOSIS — E119 Type 2 diabetes mellitus without complications: Principal | ICD-10-CM

## 2019-11-15 DIAGNOSIS — J45909 Unspecified asthma, uncomplicated: Principal | ICD-10-CM

## 2019-11-15 DIAGNOSIS — R05 Cough: Principal | ICD-10-CM

## 2019-11-15 MED ORDER — SODIUM CHLORIDE 3 % FOR NEBULIZATION
Freq: Two times a day (BID) | RESPIRATORY_TRACT | 12 refills | 94.00000 days | Status: CP | PRN
Start: 2019-11-15 — End: 2020-11-14

## 2019-12-16 ENCOUNTER — Ambulatory Visit: Admit: 2019-12-16 | Discharge: 2019-12-16 | Payer: MEDICARE

## 2019-12-16 ENCOUNTER — Encounter: Admit: 2019-12-16 | Discharge: 2019-12-16 | Payer: MEDICARE

## 2019-12-16 DIAGNOSIS — R5383 Other fatigue: Principal | ICD-10-CM

## 2019-12-16 DIAGNOSIS — R519 Nonintractable headache, unspecified chronicity pattern, unspecified headache type: Principal | ICD-10-CM

## 2019-12-16 DIAGNOSIS — R0602 Shortness of breath: Principal | ICD-10-CM

## 2020-01-18 ENCOUNTER — Encounter
Admit: 2020-01-18 | Discharge: 2020-01-19 | Payer: MEDICARE | Attending: Student in an Organized Health Care Education/Training Program | Primary: Student in an Organized Health Care Education/Training Program

## 2020-01-18 DIAGNOSIS — J45909 Unspecified asthma, uncomplicated: Principal | ICD-10-CM

## 2020-01-18 DIAGNOSIS — R0602 Shortness of breath: Principal | ICD-10-CM

## 2020-01-18 DIAGNOSIS — I10 Essential (primary) hypertension: Principal | ICD-10-CM

## 2020-01-18 DIAGNOSIS — F0390 Unspecified dementia without behavioral disturbance: Principal | ICD-10-CM

## 2020-01-18 MED ORDER — IPRATROPIUM 0.5 MG-ALBUTEROL 3 MG (2.5 MG BASE)/3 ML NEBULIZATION SOLN
Freq: Four times a day (QID) | RESPIRATORY_TRACT | 3 refills | 1.00000 days | Status: CP | PRN
Start: 2020-01-18 — End: 2021-01-17

## 2020-01-18 MED ORDER — ALBUTEROL SULFATE 2.5 MG/3 ML (0.083 %) SOLUTION FOR NEBULIZATION
Freq: Four times a day (QID) | RESPIRATORY_TRACT | 1 refills | 1 days | Status: CP | PRN
Start: 2020-01-18 — End: 2021-01-17

## 2020-01-18 MED ORDER — LOSARTAN 50 MG TABLET
ORAL_TABLET | Freq: Every day | ORAL | 3 refills | 45.00000 days | Status: CP
Start: 2020-01-18 — End: 2021-01-17

## 2020-01-18 MED ORDER — SODIUM CHLORIDE 3 % FOR NEBULIZATION
Freq: Two times a day (BID) | RESPIRATORY_TRACT | 12 refills | 94.00000 days | Status: SS | PRN
Start: 2020-01-18 — End: 2020-01-23

## 2020-01-22 ENCOUNTER — Non-Acute Institutional Stay: Admit: 2020-01-22 | Discharge: 2020-01-23 | Payer: MEDICARE

## 2020-01-22 ENCOUNTER — Ambulatory Visit: Admit: 2020-01-22 | Discharge: 2020-01-23 | Payer: MEDICARE

## 2020-01-22 DIAGNOSIS — R918 Other nonspecific abnormal finding of lung field: Principal | ICD-10-CM

## 2020-01-22 DIAGNOSIS — Z20822 Contact with and (suspected) exposure to covid-19: Principal | ICD-10-CM

## 2020-01-22 DIAGNOSIS — R000 Tachycardia, unspecified: Principal | ICD-10-CM

## 2020-01-22 DIAGNOSIS — R0902 Hypoxemia: Principal | ICD-10-CM

## 2020-01-22 DIAGNOSIS — J4551 Severe persistent asthma with (acute) exacerbation: Principal | ICD-10-CM

## 2020-01-22 DIAGNOSIS — Z79899 Other long term (current) drug therapy: Principal | ICD-10-CM

## 2020-01-22 DIAGNOSIS — R9431 Abnormal electrocardiogram [ECG] [EKG]: Principal | ICD-10-CM

## 2020-01-22 DIAGNOSIS — J45909 Unspecified asthma, uncomplicated: Principal | ICD-10-CM

## 2020-01-22 DIAGNOSIS — R062 Wheezing: Principal | ICD-10-CM

## 2020-01-22 DIAGNOSIS — I10 Essential (primary) hypertension: Principal | ICD-10-CM

## 2020-01-22 DIAGNOSIS — F0390 Unspecified dementia without behavioral disturbance: Principal | ICD-10-CM

## 2020-01-22 DIAGNOSIS — R1110 Vomiting, unspecified: Principal | ICD-10-CM

## 2020-01-22 DIAGNOSIS — F419 Anxiety disorder, unspecified: Principal | ICD-10-CM

## 2020-01-22 DIAGNOSIS — Z86010 Personal history of colonic polyps: Principal | ICD-10-CM

## 2020-01-22 DIAGNOSIS — R7303 Prediabetes: Principal | ICD-10-CM

## 2020-01-22 DIAGNOSIS — R0602 Shortness of breath: Principal | ICD-10-CM

## 2020-01-22 DIAGNOSIS — R059 Cough: Principal | ICD-10-CM

## 2020-01-23 MED ORDER — DEXTROMETHORPHAN-GUAIFENESIN 10 MG-100 MG/5 ML ORAL SYRUP
Freq: Four times a day (QID) | ORAL | 0 refills | 6.00000 days | Status: CP | PRN
Start: 2020-01-23 — End: 2020-02-01

## 2020-01-23 MED ORDER — ALBUTEROL SULFATE HFA 90 MCG/ACTUATION AEROSOL INHALER
Freq: Four times a day (QID) | RESPIRATORY_TRACT | 2 refills | 0 days | Status: CP | PRN
Start: 2020-01-23 — End: ?

## 2020-01-23 MED ORDER — IPRATROPIUM 0.5 MG-ALBUTEROL 3 MG (2.5 MG BASE)/3 ML NEBULIZATION SOLN
Freq: Four times a day (QID) | RESPIRATORY_TRACT | 3 refills | 1.00000 days | Status: CP
Start: 2020-01-23 — End: 2020-02-07

## 2020-01-23 MED ORDER — ACETAMINOPHEN 325 MG TABLET
Freq: Three times a day (TID) | ORAL | 0 days | PRN
Start: 2020-01-23 — End: 2020-02-01

## 2020-01-23 MED ORDER — SODIUM CHLORIDE 3 % FOR NEBULIZATION
Freq: Two times a day (BID) | RESPIRATORY_TRACT | 12 refills | 94 days | Status: CP | PRN
Start: 2020-01-23 — End: 2021-01-22

## 2020-01-24 MED ORDER — PREDNISONE 20 MG TABLET
ORAL_TABLET | Freq: Every day | ORAL | 0 refills | 3.00000 days | Status: CP
Start: 2020-01-24 — End: 2020-02-01

## 2020-02-01 ENCOUNTER — Encounter: Admit: 2020-02-01 | Discharge: 2020-02-02 | Payer: MEDICARE

## 2020-02-01 ENCOUNTER — Ambulatory Visit: Admit: 2020-02-01 | Discharge: 2020-02-02 | Payer: MEDICARE

## 2020-02-01 DIAGNOSIS — I10 Essential (primary) hypertension: Principal | ICD-10-CM

## 2020-02-01 DIAGNOSIS — J4541 Moderate persistent asthma with (acute) exacerbation: Principal | ICD-10-CM

## 2020-02-01 DIAGNOSIS — F0390 Unspecified dementia without behavioral disturbance: Principal | ICD-10-CM

## 2020-02-01 MED ORDER — PREDNISONE 20 MG TABLET
ORAL_TABLET | Freq: Every day | ORAL | 0 refills | 5.00000 days | Status: CP
Start: 2020-02-01 — End: 2020-02-06

## 2020-02-01 MED ORDER — LOSARTAN 100 MG TABLET
ORAL_TABLET | Freq: Every day | ORAL | 3 refills | 90 days | Status: CP
Start: 2020-02-01 — End: 2020-04-17

## 2020-02-01 MED ORDER — AMLODIPINE 5 MG TABLET
ORAL_TABLET | Freq: Every day | ORAL | 3 refills | 30 days | Status: CP
Start: 2020-02-01 — End: 2020-02-07

## 2020-02-01 MED ORDER — MONTELUKAST 10 MG TABLET
ORAL_TABLET | Freq: Every day | ORAL | 2 refills | 30.00000 days | Status: SS
Start: 2020-02-01 — End: 2020-02-07

## 2020-02-01 MED ORDER — DULERA 200 MCG-5 MCG/ACTUATION HFA AEROSOL INHALER
Freq: Two times a day (BID) | RESPIRATORY_TRACT | 1 refills | 22 days | Status: SS
Start: 2020-02-01 — End: 2020-02-07

## 2020-02-06 ENCOUNTER — Non-Acute Institutional Stay: Admit: 2020-02-06 | Discharge: 2020-02-07 | Payer: MEDICARE

## 2020-02-06 ENCOUNTER — Encounter: Admit: 2020-02-06 | Discharge: 2020-02-07 | Payer: MEDICARE

## 2020-02-06 DIAGNOSIS — F419 Anxiety disorder, unspecified: Principal | ICD-10-CM

## 2020-02-06 DIAGNOSIS — R7303 Prediabetes: Principal | ICD-10-CM

## 2020-02-06 DIAGNOSIS — R413 Other amnesia: Principal | ICD-10-CM

## 2020-02-06 DIAGNOSIS — Z87891 Personal history of nicotine dependence: Principal | ICD-10-CM

## 2020-02-06 DIAGNOSIS — F0390 Unspecified dementia without behavioral disturbance: Principal | ICD-10-CM

## 2020-02-06 DIAGNOSIS — J4541 Moderate persistent asthma with (acute) exacerbation: Principal | ICD-10-CM

## 2020-02-06 DIAGNOSIS — I10 Essential (primary) hypertension: Principal | ICD-10-CM

## 2020-02-06 DIAGNOSIS — J4551 Severe persistent asthma with (acute) exacerbation: Principal | ICD-10-CM

## 2020-02-06 DIAGNOSIS — R0902 Hypoxemia: Principal | ICD-10-CM

## 2020-02-06 DIAGNOSIS — Z86010 Personal history of colonic polyps: Principal | ICD-10-CM

## 2020-02-06 DIAGNOSIS — Z79899 Other long term (current) drug therapy: Principal | ICD-10-CM

## 2020-02-06 DIAGNOSIS — I82409 Acute embolism and thrombosis of unspecified deep veins of unspecified lower extremity: Principal | ICD-10-CM

## 2020-02-07 DIAGNOSIS — R0902 Hypoxemia: Principal | ICD-10-CM

## 2020-02-07 DIAGNOSIS — F419 Anxiety disorder, unspecified: Principal | ICD-10-CM

## 2020-02-07 DIAGNOSIS — Z79899 Other long term (current) drug therapy: Principal | ICD-10-CM

## 2020-02-07 DIAGNOSIS — R413 Other amnesia: Principal | ICD-10-CM

## 2020-02-07 DIAGNOSIS — Z86010 Personal history of colonic polyps: Principal | ICD-10-CM

## 2020-02-07 DIAGNOSIS — I82409 Acute embolism and thrombosis of unspecified deep veins of unspecified lower extremity: Principal | ICD-10-CM

## 2020-02-07 DIAGNOSIS — I10 Essential (primary) hypertension: Principal | ICD-10-CM

## 2020-02-07 DIAGNOSIS — Z87891 Personal history of nicotine dependence: Principal | ICD-10-CM

## 2020-02-07 DIAGNOSIS — J4551 Severe persistent asthma with (acute) exacerbation: Principal | ICD-10-CM

## 2020-02-07 DIAGNOSIS — R7303 Prediabetes: Principal | ICD-10-CM

## 2020-02-07 DIAGNOSIS — F0390 Unspecified dementia without behavioral disturbance: Principal | ICD-10-CM

## 2020-02-07 MED ORDER — MONTELUKAST 10 MG TABLET
ORAL_TABLET | Freq: Every day | ORAL | 3 refills | 90 days | Status: CP
Start: 2020-02-07 — End: 2021-02-06

## 2020-02-07 MED ORDER — DULERA 200 MCG-5 MCG/ACTUATION HFA AEROSOL INHALER
Freq: Two times a day (BID) | RESPIRATORY_TRACT | 1 refills | 33 days | Status: CP
Start: 2020-02-07 — End: ?

## 2020-02-08 MED ORDER — PREDNISONE 20 MG TABLET
ORAL_TABLET | Freq: Every day | ORAL | 0 refills | 3 days | Status: CP
Start: 2020-02-08 — End: 2020-02-11

## 2020-02-10 ENCOUNTER — Encounter: Admit: 2020-02-10 | Discharge: 2020-02-11 | Payer: MEDICARE

## 2020-02-10 ENCOUNTER — Ambulatory Visit: Admit: 2020-02-10 | Discharge: 2020-02-11 | Payer: MEDICARE

## 2020-02-10 DIAGNOSIS — E785 Hyperlipidemia, unspecified: Principal | ICD-10-CM

## 2020-02-10 DIAGNOSIS — J45909 Unspecified asthma, uncomplicated: Principal | ICD-10-CM

## 2020-02-10 DIAGNOSIS — J4541 Moderate persistent asthma with (acute) exacerbation: Principal | ICD-10-CM

## 2020-02-10 DIAGNOSIS — R0602 Shortness of breath: Principal | ICD-10-CM

## 2020-02-10 DIAGNOSIS — I10 Essential (primary) hypertension: Principal | ICD-10-CM

## 2020-02-10 DIAGNOSIS — F0390 Unspecified dementia without behavioral disturbance: Principal | ICD-10-CM

## 2020-02-10 DIAGNOSIS — R062 Wheezing: Principal | ICD-10-CM

## 2020-02-10 MED ORDER — ATORVASTATIN 40 MG TABLET
ORAL_TABLET | Freq: Every day | ORAL | 3 refills | 90 days | Status: CP
Start: 2020-02-10 — End: 2021-02-09

## 2020-04-17 ENCOUNTER — Encounter
Admit: 2020-04-17 | Discharge: 2020-04-18 | Payer: MEDICARE | Attending: Student in an Organized Health Care Education/Training Program | Primary: Student in an Organized Health Care Education/Training Program

## 2020-04-17 DIAGNOSIS — E7800 Pure hypercholesterolemia, unspecified: Principal | ICD-10-CM

## 2020-04-17 DIAGNOSIS — J4541 Moderate persistent asthma with (acute) exacerbation: Principal | ICD-10-CM

## 2020-04-17 DIAGNOSIS — I2510 Atherosclerotic heart disease of native coronary artery without angina pectoris: Principal | ICD-10-CM

## 2020-04-17 DIAGNOSIS — I10 Essential (primary) hypertension: Principal | ICD-10-CM

## 2020-04-17 MED ORDER — LOSARTAN 100 MG-HYDROCHLOROTHIAZIDE 25 MG TABLET
ORAL_TABLET | Freq: Every day | ORAL | 11 refills | 30 days | Status: CP
Start: 2020-04-17 — End: 2021-04-17

## 2020-05-12 ENCOUNTER — Ambulatory Visit: Admit: 2020-05-12 | Discharge: 2020-05-15 | Payer: MEDICARE

## 2020-05-12 ENCOUNTER — Encounter: Admit: 2020-05-12 | Discharge: 2020-05-15 | Payer: MEDICARE

## 2020-05-15 MED ORDER — SERTRALINE 25 MG TABLET
ORAL_TABLET | Freq: Every day | ORAL | 0 refills | 30 days | Status: CP
Start: 2020-05-15 — End: ?

## 2020-05-16 MED ORDER — PREDNISONE 20 MG TABLET
ORAL_TABLET | Freq: Every day | ORAL | 0 refills | 1 days | Status: CP
Start: 2020-05-16 — End: 2020-05-17

## 2020-05-18 ENCOUNTER — Ambulatory Visit: Admit: 2020-05-18 | Payer: MEDICARE

## 2020-09-12 ENCOUNTER — Encounter: Admit: 2020-09-12 | Discharge: 2020-09-13 | Disposition: A | Discharge status: Home / Self Care | Payer: MEDICARE

## 2020-09-13 MED ORDER — PREDNISONE 20 MG TABLET
ORAL_TABLET | Freq: Every day | ORAL | 0 refills | 4 days | Status: CP
Start: 2020-09-13 — End: 2020-09-17

## 2020-09-13 MED ORDER — ALBUTEROL SULFATE HFA 90 MCG/ACTUATION AEROSOL INHALER
Freq: Four times a day (QID) | RESPIRATORY_TRACT | 3 refills | 0 days | Status: CP | PRN
Start: 2020-09-13 — End: ?

## 2020-09-22 ENCOUNTER — Encounter: Admit: 2020-09-22 | Discharge: 2020-09-23 | Payer: MEDICARE

## 2020-09-22 MED ORDER — DULERA 200 MCG-5 MCG/ACTUATION HFA AEROSOL INHALER
Freq: Two times a day (BID) | RESPIRATORY_TRACT | 1 refills | 33 days | Status: CP
Start: 2020-09-22 — End: 2020-09-22

## 2020-09-22 MED ORDER — ALBUTEROL SULFATE HFA 90 MCG/ACTUATION AEROSOL INHALER
Freq: Four times a day (QID) | RESPIRATORY_TRACT | 3 refills | 0 days | Status: CP | PRN
Start: 2020-09-22 — End: ?

## 2020-09-22 MED ORDER — AEROCHAMBER MV SPACER
0 refills | 0 days | Status: CP
Start: 2020-09-22 — End: ?

## 2020-09-22 MED ORDER — AZITHROMYCIN 250 MG TABLET
ORAL_TABLET | 1 refills | 0 days | Status: CP
Start: 2020-09-22 — End: ?

## 2020-09-22 MED ORDER — BUDESONIDE-FORMOTEROL HFA 160 MCG-4.5 MCG/ACTUATION AEROSOL INHALER
Freq: Two times a day (BID) | RESPIRATORY_TRACT | 0 refills | 15.00000 days | Status: CP
Start: 2020-09-22 — End: 2021-09-22

## 2020-10-23 ENCOUNTER — Ambulatory Visit: Admit: 2020-10-23 | Discharge: 2020-10-24 | Payer: MEDICARE

## 2020-12-07 ENCOUNTER — Ambulatory Visit: Admit: 2020-12-07 | Discharge: 2020-12-08 | Payer: MEDICARE

## 2020-12-07 MED ORDER — BUDESONIDE-FORMOTEROL HFA 160 MCG-4.5 MCG/ACTUATION AEROSOL INHALER
Freq: Two times a day (BID) | RESPIRATORY_TRACT | 0 refills | 15 days | Status: CP
Start: 2020-12-07 — End: 2021-12-07

## 2020-12-07 MED ORDER — MONTELUKAST 10 MG TABLET
ORAL_TABLET | Freq: Every day | ORAL | 3 refills | 90 days | Status: CP
Start: 2020-12-07 — End: 2021-12-07

## 2020-12-07 MED ORDER — ATORVASTATIN 40 MG TABLET
ORAL_TABLET | Freq: Every day | ORAL | 3 refills | 90 days | Status: CP
Start: 2020-12-07 — End: 2021-12-07

## 2020-12-07 MED ORDER — PREDNISONE 50 MG TABLET
ORAL_TABLET | Freq: Every day | ORAL | 0 refills | 5 days | Status: CP
Start: 2020-12-07 — End: ?

## 2020-12-07 MED ORDER — ALBUTEROL SULFATE HFA 90 MCG/ACTUATION AEROSOL INHALER
Freq: Four times a day (QID) | RESPIRATORY_TRACT | 3 refills | 0 days | Status: CP | PRN
Start: 2020-12-07 — End: ?

## 2020-12-20 ENCOUNTER — Ambulatory Visit: Admit: 2020-12-20 | Discharge: 2020-12-20 | Discharge status: Against Medical Advice | Payer: MEDICARE

## 2020-12-26 ENCOUNTER — Ambulatory Visit: Admit: 2020-12-26 | Discharge: 2020-12-27 | Payer: MEDICARE

## 2020-12-26 ENCOUNTER — Ambulatory Visit: Admit: 2020-12-26 | Discharge: 2020-12-28 | Payer: MEDICARE

## 2020-12-26 DIAGNOSIS — R0602 Shortness of breath: Principal | ICD-10-CM

## 2020-12-26 MED ORDER — AEROCHAMBER MV SPACER
Freq: Every day | 0 refills | 1 days | Status: CP
Start: 2020-12-26 — End: ?

## 2020-12-28 MED ORDER — AEROCHAMBER MV SPACER
Freq: Every day | 0 refills | 1 days | Status: CP | PRN
Start: 2020-12-28 — End: ?

## 2020-12-28 MED ORDER — AMLODIPINE 5 MG TABLET
ORAL_TABLET | Freq: Every day | ORAL | 0 refills | 30 days | Status: CP
Start: 2020-12-28 — End: 2020-12-28

## 2020-12-28 MED ORDER — LOSARTAN 100 MG-HYDROCHLOROTHIAZIDE 25 MG TABLET
ORAL_TABLET | Freq: Every day | ORAL | 0 refills | 30 days | Status: CP
Start: 2020-12-28 — End: 2021-01-27

## 2020-12-29 MED ORDER — PREDNISONE 20 MG TABLET
ORAL_TABLET | Freq: Every day | ORAL | 0 refills | 2 days | Status: CP
Start: 2020-12-29 — End: 2020-12-31

## 2021-01-01 ENCOUNTER — Ambulatory Visit: Admit: 2021-01-01 | Payer: MEDICARE

## 2021-01-09 ENCOUNTER — Ambulatory Visit: Admit: 2021-01-09 | Discharge: 2021-01-10 | Payer: MEDICARE

## 2021-01-09 DIAGNOSIS — R0602 Shortness of breath: Principal | ICD-10-CM

## 2021-01-09 DIAGNOSIS — J471 Bronchiectasis with (acute) exacerbation: Principal | ICD-10-CM

## 2021-01-09 DIAGNOSIS — J4540 Moderate persistent asthma, uncomplicated: Principal | ICD-10-CM

## 2021-01-09 MED ORDER — PREDNISONE 10 MG TABLET
ORAL_TABLET | ORAL | 0 refills | 15 days | Status: CP
Start: 2021-01-09 — End: 2021-01-24

## 2021-01-09 MED ORDER — ALBUTEROL SULFATE 2.5 MG/3 ML (0.083 %) SOLUTION FOR NEBULIZATION
RESPIRATORY_TRACT | 3 refills | 1 days | Status: CP | PRN
Start: 2021-01-09 — End: 2022-01-09

## 2021-01-15 DIAGNOSIS — Z1231 Encounter for screening mammogram for malignant neoplasm of breast: Principal | ICD-10-CM

## 2021-01-16 DIAGNOSIS — Z1231 Encounter for screening mammogram for malignant neoplasm of breast: Principal | ICD-10-CM

## 2021-01-19 ENCOUNTER — Ambulatory Visit: Admit: 2021-01-19 | Discharge: 2021-01-20 | Payer: MEDICARE

## 2021-01-19 DIAGNOSIS — Z1231 Encounter for screening mammogram for malignant neoplasm of breast: Principal | ICD-10-CM

## 2021-01-23 ENCOUNTER — Ambulatory Visit: Admit: 2021-01-23 | Payer: MEDICARE | Attending: Pharmacist | Primary: Pharmacist

## 2021-01-29 ENCOUNTER — Ambulatory Visit: Admit: 2021-01-29 | Discharge: 2021-01-30 | Payer: MEDICARE

## 2021-01-29 MED ORDER — ARFORMOTEROL 15 MCG/2 ML SOLUTION FOR NEBULIZATION
Freq: Two times a day (BID) | RESPIRATORY_TRACT | 0 refills | 5 days | Status: CP
Start: 2021-01-29 — End: ?

## 2021-01-29 MED ORDER — BUDESONIDE 0.5 MG/2 ML SUSPENSION FOR NEBULIZATION
Freq: Two times a day (BID) | RESPIRATORY_TRACT | 2 refills | 1.00000 days | Status: CP
Start: 2021-01-29 — End: 2022-01-29
  Filled 2021-01-31: qty 60, 15d supply, fill #0

## 2021-01-29 MED ORDER — IPRATROPIUM 0.5 MG-ALBUTEROL 3 MG (2.5 MG BASE)/3 ML NEBULIZATION SOLN
Freq: Four times a day (QID) | RESPIRATORY_TRACT | 3 refills | 1 days | Status: CP | PRN
Start: 2021-01-29 — End: 2022-01-29
  Filled 2021-01-31: qty 90, 8d supply, fill #0

## 2021-01-30 DIAGNOSIS — J4550 Severe persistent asthma, uncomplicated: Principal | ICD-10-CM

## 2021-02-01 ENCOUNTER — Ambulatory Visit: Admit: 2021-02-01 | Discharge: 2021-02-02 | Payer: MEDICARE

## 2021-02-05 DIAGNOSIS — J4550 Severe persistent asthma, uncomplicated: Principal | ICD-10-CM

## 2021-02-05 DIAGNOSIS — J449 Chronic obstructive pulmonary disease, unspecified: Principal | ICD-10-CM

## 2021-02-05 MED ORDER — FORMOTEROL FUMARATE 20 MCG/2 ML SOLUTION FOR NEBULIZATION
Freq: Two times a day (BID) | RESPIRATORY_TRACT | 0 refills | 90 days | Status: CP
Start: 2021-02-05 — End: 2021-05-06

## 2021-02-06 DIAGNOSIS — J4550 Severe persistent asthma, uncomplicated: Principal | ICD-10-CM

## 2021-02-06 DIAGNOSIS — J449 Chronic obstructive pulmonary disease, unspecified: Principal | ICD-10-CM

## 2021-02-07 ENCOUNTER — Ambulatory Visit: Admit: 2021-02-07 | Discharge: 2021-02-08 | Payer: MEDICARE

## 2021-02-11 ENCOUNTER — Ambulatory Visit: Admit: 2021-02-11 | Discharge: 2021-02-16 | Payer: MEDICARE

## 2021-02-11 DIAGNOSIS — J4550 Severe persistent asthma, uncomplicated: Principal | ICD-10-CM

## 2021-02-11 DIAGNOSIS — K219 Gastro-esophageal reflux disease without esophagitis: Principal | ICD-10-CM

## 2021-02-11 DIAGNOSIS — J449 Chronic obstructive pulmonary disease, unspecified: Principal | ICD-10-CM

## 2021-02-11 DIAGNOSIS — J471 Bronchiectasis with (acute) exacerbation: Principal | ICD-10-CM

## 2021-02-11 MED ORDER — OMEPRAZOLE 20 MG TABLET,DELAYED RELEASE
ORAL_TABLET | Freq: Every day | ORAL | 3 refills | 90 days | Status: CP
Start: 2021-02-11 — End: 2022-02-11

## 2021-02-11 MED ORDER — IPRATROPIUM 0.5 MG-ALBUTEROL 3 MG (2.5 MG BASE)/3 ML NEBULIZATION SOLN
Freq: Four times a day (QID) | RESPIRATORY_TRACT | 3 refills | 84.00000 days | Status: CP | PRN
Start: 2021-02-11 — End: 2022-02-11

## 2021-02-15 MED ORDER — LOSARTAN 100 MG-HYDROCHLOROTHIAZIDE 25 MG TABLET
ORAL_TABLET | Freq: Every day | ORAL | 0 refills | 30 days | Status: CP
Start: 2021-02-15 — End: 2021-03-17

## 2021-02-19 DIAGNOSIS — Z7189 Other specified counseling: Principal | ICD-10-CM

## 2021-02-22 ENCOUNTER — Inpatient Hospital Stay
Admission: EM | Admit: 2021-02-22 | Discharge: 2021-02-25 | DRG: 191 | Disposition: A | Discharge status: Home / Self Care | Payer: Medicare Other | Attending: Internal Medicine | Admitting: Internal Medicine

## 2021-02-22 ENCOUNTER — Emergency Department: Payer: Medicare Other

## 2021-02-22 ENCOUNTER — Other Ambulatory Visit: Payer: Self-pay

## 2021-02-22 DIAGNOSIS — F0390 Unspecified dementia without behavioral disturbance: Secondary | ICD-10-CM

## 2021-02-22 DIAGNOSIS — K219 Gastro-esophageal reflux disease without esophagitis: Secondary | ICD-10-CM | POA: Diagnosis not present

## 2021-02-22 DIAGNOSIS — D75839 Thrombocytosis, unspecified: Secondary | ICD-10-CM

## 2021-02-22 DIAGNOSIS — I10 Essential (primary) hypertension: Secondary | ICD-10-CM

## 2021-02-22 DIAGNOSIS — D72829 Elevated white blood cell count, unspecified: Secondary | ICD-10-CM

## 2021-02-22 DIAGNOSIS — E860 Dehydration: Secondary | ICD-10-CM | POA: Diagnosis present

## 2021-02-22 DIAGNOSIS — Z20822 Contact with and (suspected) exposure to covid-19: Secondary | ICD-10-CM | POA: Diagnosis present

## 2021-02-22 DIAGNOSIS — J209 Acute bronchitis, unspecified: Secondary | ICD-10-CM | POA: Diagnosis present

## 2021-02-22 DIAGNOSIS — J441 Chronic obstructive pulmonary disease with (acute) exacerbation: Principal | ICD-10-CM | POA: Diagnosis present

## 2021-02-22 DIAGNOSIS — Z6828 Body mass index (BMI) 28.0-28.9, adult: Secondary | ICD-10-CM

## 2021-02-22 DIAGNOSIS — N179 Acute kidney failure, unspecified: Secondary | ICD-10-CM | POA: Diagnosis present

## 2021-02-22 DIAGNOSIS — J440 Chronic obstructive pulmonary disease with acute lower respiratory infection: Secondary | ICD-10-CM | POA: Diagnosis present

## 2021-02-22 DIAGNOSIS — D649 Anemia, unspecified: Secondary | ICD-10-CM | POA: Diagnosis present

## 2021-02-22 DIAGNOSIS — Z7951 Long term (current) use of inhaled steroids: Secondary | ICD-10-CM

## 2021-02-22 DIAGNOSIS — J45901 Unspecified asthma with (acute) exacerbation: Secondary | ICD-10-CM | POA: Diagnosis present

## 2021-02-22 DIAGNOSIS — Z23 Encounter for immunization: Secondary | ICD-10-CM

## 2021-02-22 DIAGNOSIS — J4531 Mild persistent asthma with (acute) exacerbation: Secondary | ICD-10-CM

## 2021-02-22 DIAGNOSIS — F03A0 Unspecified dementia, mild, without behavioral disturbance, psychotic disturbance, mood disturbance, and anxiety: Secondary | ICD-10-CM | POA: Diagnosis present

## 2021-02-22 DIAGNOSIS — R739 Hyperglycemia, unspecified: Secondary | ICD-10-CM

## 2021-02-22 DIAGNOSIS — J449 Chronic obstructive pulmonary disease, unspecified: Secondary | ICD-10-CM | POA: Diagnosis not present

## 2021-02-22 DIAGNOSIS — Z79899 Other long term (current) drug therapy: Secondary | ICD-10-CM

## 2021-02-22 DIAGNOSIS — E782 Mixed hyperlipidemia: Secondary | ICD-10-CM

## 2021-02-22 HISTORY — DX: Unspecified dementia, unspecified severity, without behavioral disturbance, psychotic disturbance, mood disturbance, and anxiety: F03.90

## 2021-02-22 HISTORY — DX: Essential (primary) hypertension: I10

## 2021-02-22 LAB — BASIC METABOLIC PANEL
Anion gap: 8 (ref 5–15)
BUN: 5 mg/dL — ABNORMAL LOW (ref 8–23)
CO2: 27 mmol/L (ref 22–32)
Calcium: 8.7 mg/dL — ABNORMAL LOW (ref 8.9–10.3)
Chloride: 104 mmol/L (ref 98–111)
Creatinine, Ser: 0.81 mg/dL (ref 0.44–1.00)
GFR, Estimated: >60 mL/min (ref >60)
Glucose, Bld: 118 mg/dL — ABNORMAL HIGH (ref 70–99)
Potassium: 3.6 mmol/L (ref 3.5–5.1)
Sodium: 139 mmol/L (ref 135–145)

## 2021-02-22 LAB — CBC
HCT: 46.4 % — ABNORMAL HIGH (ref 36.0–46.0)
Hemoglobin: 15.3 g/dL — ABNORMAL HIGH (ref 12.0–15.0)
MCH: 28.7 pg (ref 26.0–34.0)
MCHC: 33 g/dL (ref 30.0–36.0)
MCV: 87.1 fL (ref 80.0–100.0)
Platelets: 444 10*3/uL — ABNORMAL HIGH (ref 150–400)
RBC: 5.33 MIL/uL — ABNORMAL HIGH (ref 3.87–5.11)
RDW: 13 % (ref 11.5–15.5)
WBC: 11.4 10*3/uL — ABNORMAL HIGH (ref 4.0–10.5)
nRBC: 0 % (ref 0.0–0.2)

## 2021-02-22 LAB — BRAIN NATRIURETIC PEPTIDE: B Natriuretic Peptide: 6.9 pg/mL (ref 0.0–100.0)

## 2021-02-22 LAB — RESP PANEL BY RT-PCR (FLU A&B, COVID) ARPGX2
Influenza A by PCR: NEGATIVE
Influenza B by PCR: NEGATIVE
SARS Coronavirus 2 by RT PCR: NEGATIVE

## 2021-02-22 LAB — TROPONIN I (HIGH SENSITIVITY): Troponin I (High Sensitivity): 2 ng/L (ref <18)

## 2021-02-22 MED ORDER — HYDROCHLOROTHIAZIDE 25 MG PO TABS
25.0000 mg | ORAL_TABLET | Freq: Every day | ORAL | Status: DC
  Administered 2021-02-23 – 2021-02-24 (×2): 25 mg via ORAL
  Filled 2021-02-22 (×2): qty 1

## 2021-02-22 MED ORDER — DM-GUAIFENESIN ER 30-600 MG PO TB12
1.0000 | ORAL_TABLET | Freq: Two times a day (BID) | ORAL | Status: DC
  Administered 2021-02-22 – 2021-02-25 (×6): 1 via ORAL
  Filled 2021-02-22 (×6): qty 1

## 2021-02-22 MED ORDER — AZITHROMYCIN 500 MG PO TABS
250.0000 mg | ORAL_TABLET | Freq: Every day | ORAL | Status: DC
  Administered 2021-02-23: 09:00:00 250 mg via ORAL
  Filled 2021-02-22: qty 1

## 2021-02-22 MED ORDER — ENOXAPARIN SODIUM 40 MG/0.4ML IJ SOSY
40.0000 mg | PREFILLED_SYRINGE | INTRAMUSCULAR | Status: DC
  Administered 2021-02-22 – 2021-02-24 (×3): 40 mg via SUBCUTANEOUS
  Filled 2021-02-22 (×3): qty 0.4

## 2021-02-22 MED ORDER — LOSARTAN POTASSIUM-HCTZ 100-25 MG PO TABS: 1.0000 | ORAL_TABLET | Freq: Every day | ORAL | Status: DC

## 2021-02-22 MED ORDER — METHYLPREDNISOLONE SODIUM SUCC 125 MG IJ SOLR
125.0000 mg | Freq: Once | INTRAMUSCULAR | Status: AC
  Administered 2021-02-22: 10:00:00 125 mg via INTRAVENOUS
  Filled 2021-02-22: qty 2

## 2021-02-22 MED ORDER — METHYLPREDNISOLONE SODIUM SUCC 40 MG IJ SOLR
80.0000 mg | INTRAMUSCULAR | Status: DC
  Filled 2021-02-22: qty 2

## 2021-02-22 MED ORDER — PANTOPRAZOLE SODIUM 40 MG PO TBEC
40.0000 mg | DELAYED_RELEASE_TABLET | Freq: Every day | ORAL | Status: DC
  Filled 2021-02-22 (×2): qty 1

## 2021-02-22 MED ORDER — IPRATROPIUM-ALBUTEROL 0.5-2.5 (3) MG/3ML IN SOLN
3.0000 mL | Freq: Once | RESPIRATORY_TRACT | Status: AC
  Administered 2021-02-22: 11:00:00 3 mL via RESPIRATORY_TRACT
  Filled 2021-02-22: qty 3

## 2021-02-22 MED ORDER — IPRATROPIUM-ALBUTEROL 0.5-2.5 (3) MG/3ML IN SOLN
3.0000 mL | Freq: Four times a day (QID) | RESPIRATORY_TRACT | Status: DC
  Administered 2021-02-23 – 2021-02-25 (×10): 3 mL via RESPIRATORY_TRACT
  Filled 2021-02-22 (×11): qty 3

## 2021-02-22 MED ORDER — IPRATROPIUM-ALBUTEROL 0.5-2.5 (3) MG/3ML IN SOLN
3.0000 mL | RESPIRATORY_TRACT | Status: DC | PRN
  Administered 2021-02-22: 17:00:00 3 mL via RESPIRATORY_TRACT

## 2021-02-22 MED ORDER — LOSARTAN POTASSIUM 50 MG PO TABS
100.0000 mg | ORAL_TABLET | Freq: Every day | ORAL | Status: DC
  Administered 2021-02-23 – 2021-02-24 (×2): 100 mg via ORAL
  Filled 2021-02-22 (×2): qty 2

## 2021-02-22 MED ORDER — AZITHROMYCIN 500 MG PO TABS
500.0000 mg | ORAL_TABLET | Freq: Every day | ORAL | Status: AC
  Administered 2021-02-22: 17:00:00 500 mg via ORAL
  Filled 2021-02-22: qty 1

## 2021-02-22 MED ORDER — BUDESONIDE 0.5 MG/2ML IN SUSP
0.5000 mg | Freq: Two times a day (BID) | RESPIRATORY_TRACT | Status: DC
  Administered 2021-02-22: 23:00:00 0.5 mg via RESPIRATORY_TRACT
  Filled 2021-02-22 (×2): qty 2

## 2021-02-22 MED ORDER — ATORVASTATIN CALCIUM 20 MG PO TABS
40.0000 mg | ORAL_TABLET | Freq: Every day | ORAL | Status: DC
  Administered 2021-02-23 – 2021-02-25 (×3): 40 mg via ORAL
  Filled 2021-02-22 (×3): qty 2

## 2021-02-22 MED ORDER — MONTELUKAST SODIUM 10 MG PO TABS
10.0000 mg | ORAL_TABLET | Freq: Every day | ORAL | Status: DC
  Administered 2021-02-23 – 2021-02-25 (×3): 10 mg via ORAL
  Filled 2021-02-22 (×3): qty 1

## 2021-02-22 NOTE — ED Notes (Signed)
Pt given turkey sandwich tray 

## 2021-02-22 NOTE — ED Notes (Signed)
[phone] Janette Torain called wanting update on pt. Pt gave verbal permission for this RN to update Torain. Briefly updated. Pt alert, resting calmly on stretcher, skin dry, resp reg/unlabored, rail up, call bell within reach.

## 2021-02-22 NOTE — H&P (Signed)
History and Physical  Alyssa Santiago AOZ:[id number] DOB: 1950/10/13 DOA: 02/22/2021  Referring physician:  Marjean Donna, MD PCP: Alyssa Crofts, MD  Patient coming from: Home  Chief Complaint: Shortness of breath  HPI: Alyssa Santiago is a 70 y.o. female with medical history significant for asthma, dementia, hyperlipidemia, hypertension, GERD who presents to the emergency department due to worsening shortness of breath.  Patient complained of 1 day onset of worsening shortness of breath associated with wheezing, she states that she used home albuterol inhaler without improvement, shortness of breath continued today, so she presents to the emergency for further evaluation and management.  She denies chest pain, fever, chills, abdominal pain, leg swelling.  ED Course:  In the emergency department, she was hemodynamically stable.  Work-up in the ED showed mild leukocytosis and normocytic anemia, normal BMP except for mild hyperglycemia.  BNP 6.9, troponin x1 - 2.  Influenza A, B, SARS coronavirus 2 was negative. Chest x-ray showed no acute process in the chest Breathing treatment was provided, Solu-Medrol was given.  Hospitalist was asked to admit patient for further evaluation and management.  Review of Systems: Constitutional: Negative for chills and fever.  HENT: Negative for ear pain and sore throat.   Eyes: Negative for pain and visual disturbance.  Respiratory: Positive for shortness of breath.  Negative for cough, chest tightness  Cardiovascular: Negative for chest pain and palpitations.  Gastrointestinal: Negative for abdominal pain and vomiting.  Endocrine: Negative for polyphagia and polyuria.  Genitourinary: Negative for decreased urine volume, dysuria, enuresis Musculoskeletal: Negative for arthralgias and back pain.  Skin: Negative for color change and rash.  Allergic/Immunologic: Negative for immunocompromised state.  Neurological: Negative for tremors, syncope, speech  difficulty.  Hematological: Does not bruise/bleed easily.  All other systems reviewed and are negative   Past Medical History:  Diagnosis Date   Dementia ([place])    Hypertension    No past surgical history on file.  Social History:  reports that she has never smoked. She does not have any smokeless tobacco history on file. She reports that she does not currently use drugs. She reports that she does not drink alcohol.   No Known Allergies  No family history on file.   Prior to Admission medications   Medication Sig Start Date End Date Taking? Authorizing Provider  amLODipine (NORVASC) 10 MG tablet Take 10 mg by mouth daily.    [provider]  butalbital-acetaminophen-caffeine (FIORICET) 50-325-40 MG tablet Take by mouth 2 (two) times daily as needed for headache.    [provider]  calcium-vitamin D (OSCAL WITH D) 500-200 MG-UNIT tablet Take 1 tablet by mouth.    [provider]  diazepam (VALIUM) 2 MG tablet Take 1 tablet (2 mg total) by mouth every 8 (eight) hours as needed for muscle spasms. 09/05/15   Alyssa Blanch, MD  fluticasone (FLONASE) 50 MCG/ACT nasal spray Place into both nostrils daily.    [provider]  fluticasone (FLOVENT HFA) 220 MCG/ACT inhaler Inhale into the lungs 2 (two) times daily.    [provider]  mometasone-formoterol (DULERA) 100-5 MCG/ACT AERO Inhale 2 puffs into the lungs 2 (two) times daily.    [provider]    Physical Exam: BP (!) 153/85    Pulse (!) 113    Temp 98 F (36.7 C) (Oral)    Resp (!) 24    Ht 5\' 7"  (1.702 m)    Wt 82.6 kg    SpO2 95%  BMI 28.51 kg/m   General: 70 y.o. year-old female well developed well nourished in no acute distress.  Alert and oriented x3. HEENT: NCAT, EOMI Neck: Supple, trachea medial Cardiovascular: Regular rate and rhythm with no rubs or gallops.  No thyromegaly or JVD noted.  No lower extremity edema. 2/4 pulses in all 4 extremities. Respiratory:  Decreased breath sounds with scattered expiratory wheezes.   Abdomen: Soft, nontender nondistended with normal bowel sounds x4 quadrants. Muskuloskeletal: No cyanosis, clubbing or edema noted bilaterally Neuro: CN II-XII intact, strength 5/5 x 4, sensation, reflexes intact Skin: No ulcerative lesions noted or rashes Psychiatry: Mood is appropriate for condition and setting          Labs on Admission:  Basic Metabolic Panel: Recent Labs  Lab 02/22/21 0904  NA 139  K 3.6  CL 104  CO2 27  GLUCOSE 118*  BUN 5*  CREATININE 0.81  CALCIUM 8.7*   Liver Function Tests: No results for input(s): AST, ALT, ALKPHOS, BILITOT, PROT, ALBUMIN in the last 168 hours. No results for input(s): LIPASE, AMYLASE in the last 168 hours. No results for input(s): AMMONIA in the last 168 hours. CBC: Recent Labs  Lab 02/22/21 0904  WBC 11.4*  HGB 15.3*  HCT 46.4*  MCV 87.1  PLT 444*   Cardiac Enzymes: No results for input(s): CKTOTAL, CKMB, CKMBINDEX, TROPONINI in the last 168 hours.  BNP (last 3 results) Recent Labs    02/22/21 0904  BNP 6.9    ProBNP (last 3 results) No results for input(s): PROBNP in the last 8760 hours.  CBG: No results for input(s): GLUCAP in the last 168 hours.  Radiological Exams on Admission: DG Chest 2 View  Result Date: 02/22/2021 CLINICAL DATA:  Shortness of breath EXAM: CHEST - 2 VIEW COMPARISON:  None. FINDINGS: The heart size and mediastinal contours are within normal limits. Both lungs are clear. No pleural effusion or pneumothorax. Dextroscoliosis lower thoracic spine. IMPRESSION: No acute process in the chest. Electronically Signed   By: Macy Mis M.D.   On: 02/22/2021 09:41    EKG: I independently viewed the EKG done and my findings are as followed: Normal sinus rhythm at a rate of 87 bpm  Assessment/Plan Present on Admission:  Asthma exacerbation  Principal Problem:   Asthma exacerbation Active Problems:   Thrombocytosis   Leukocytosis    Hyperglycemia   Essential hypertension   Mixed hyperlipidemia   GERD (gastroesophageal reflux disease)   Dementia (H[facility])    Acute asthma exacerbation Continue duo nebs, Mucinex, Solu-Medrol, azithromycin, Pulmicort, Singulair. Continue Protonix to prevent steroid-induced ulcer Continue incentive spirometry and flutter valve Patient does not require any oxygen supplementation at this time  Leukocytosis/thrombocytosis/hyperglycemia possibly reactive Continue to monitor WBC, platelets and glucose with morning labs  Essential hypertension Continue Hyzaar  Mixed hyperlipidemia Continue Lipitor  GERD Continue Protonix  Dementia Patient at baseline dementia Not on home meds  DVT prophylaxis: Lovenox  Code Status: Full code  Family Communication: None at bedside  Disposition Plan:  Patient is from:                        home Anticipated DC to:                   SNF or family members home Anticipated DC date:               2-3 days Anticipated DC barriers:  Patient requires inpatient management due to severity of symptoms  Consults called: None  Admission status: Observation    Bernadette Hoit MD [facility]  02/22/2021, 4:06 PM

## 2021-02-22 NOTE — ED Notes (Signed)
Pt ambulating down hall with oxygen sats remaining at 95% on RA. Pt has audible congestion and wheezing whiile ambulating. Pt had a period of dizziness when standing but state that that is normal for her. Pt HR went up to 120 while ambulating. Pt placed back in bed with no issue.

## 2021-02-22 NOTE — ED Notes (Signed)
Called dietary for lunch tray for patient.

## 2021-02-22 NOTE — ED Provider Notes (Signed)
[facility] Emergency Department Provider Note  ____________________________________________   Event Date/Time   First MD Initiated Contact with Patient 02/22/21 [phone]     (approximate)  I have reviewed the triage vital signs and the nursing notes.   HISTORY  Chief Complaint Shortness of Breath and Chest Pain    HPI Hasana Rominger is a 70 y.o. female with hypertension, dementia, asthma who comes in with shortness of breath.  Patient reports that she lives with her daughter.  She states that she states she has a history of asthma.  She is not sure if she has any inhalers at home.  She reports having some wheezing that started yesterday, intermittent, nothing makes it better or worse.  She has no reported chest pain but patient is denying any chest discomfort to me.  Denies any abdominal pain, leg swelling, history of blood clots that she knows of, no history of heart attacks.  She denies any fevers.  Does have a little bit of a cough.           Past Medical History:  Diagnosis Date   Dementia ([place])    Hypertension     There are no problems to display for this patient.   No past surgical history on file.  Prior to Admission medications   Medication Sig Start Date End Date Taking? Authorizing Provider  amLODipine (NORVASC) 10 MG tablet Take 10 mg by mouth daily.    [provider]  butalbital-acetaminophen-caffeine (FIORICET) 50-325-40 MG tablet Take by mouth 2 (two) times daily as needed for headache.    [provider]  calcium-vitamin D (OSCAL WITH D) 500-200 MG-UNIT tablet Take 1 tablet by mouth.    [provider]  diazepam (VALIUM) 2 MG tablet Take 1 tablet (2 mg total) by mouth every 8 (eight) hours as needed for muscle spasms. 09/05/15   Paulette Blanch, MD  fluticasone (FLONASE) 50 MCG/ACT nasal spray Place into both nostrils daily.    [provider]  fluticasone (FLOVENT HFA) 220 MCG/ACT inhaler Inhale  into the lungs 2 (two) times daily.    [provider]  mometasone-formoterol (DULERA) 100-5 MCG/ACT AERO Inhale 2 puffs into the lungs 2 (two) times daily.    [provider]    Allergies Patient has no known allergies.  No family history on file.  Social History Social History   Tobacco Use   Smoking status: Never  Substance Use Topics   Alcohol use: No   Drug use: Not Currently      Review of Systems Constitutional: No fever/chills Eyes: No visual changes. ENT: No sore throat. Cardiovascular: Positive chest pain per triage but pt deny to me Respiratory: Positive shortness of breath, cough Gastrointestinal: No abdominal pain.  No nausea, no vomiting.  No diarrhea.  No constipation. Genitourinary: Negative for dysuria. Musculoskeletal: Negative for back pain. Skin: Negative for rash. Neurological: Negative for headaches, focal weakness or numbness. All other ROS negative ____________________________________________   PHYSICAL EXAM:  VITAL SIGNS: ED Triage Vitals  Enc Vitals Group     BP 02/22/21 0901 (!) 151/90     Pulse Rate 02/22/21 0901 86     Resp 02/22/21 0901 18     Temp 02/22/21 0901 98 F (36.7 C)     Temp Source 02/22/21 0901 Oral     SpO2 02/22/21 0901 93 %     Weight 02/22/21 0855 182 lb (82.6 kg)     Height 02/22/21 0855 5\' 7"  (1.702  m)     Head Circumference --      Peak Flow --      Pain Score 02/22/21 0855 0     Pain Loc --      Pain Edu? --      Excl. in [place]? --     Constitutional: Alert and oriented. Well appearing and in no acute distress. Eyes: Conjunctivae are normal. EOMI. Head: Atraumatic. Nose: No congestion/rhinnorhea. Mouth/Throat: Mucous membranes are moist.   Neck: No stridor. Trachea Midline. FROM Cardiovascular: Normal rate, regular rhythm. Grossly normal heart sounds.  Good peripheral circulation. Respiratory: Normal respiratory effort.  No retractions. Lungs CTAB. Gastrointestinal: Soft and nontender.  No distention. No abdominal bruits.  Musculoskeletal: No lower extremity tenderness nor edema.  No joint effusions. Neurologic:  Normal speech and language. No gross focal neurologic deficits are appreciated.  Skin:  Skin is warm, dry and intact. No rash noted. Psychiatric: Mood and affect are normal. Speech and behavior are normal. GU: Deferred   ____________________________________________   LABS (all labs ordered are listed, but only abnormal results are displayed)  Labs Reviewed  RESP PANEL BY RT-PCR (FLU A&B, COVID) ARPGX2  BASIC METABOLIC PANEL  CBC  BRAIN NATRIURETIC PEPTIDE  TROPONIN I (HIGH SENSITIVITY)   ____________________________________________   ED ECG REPORT I, Vanessa Oglala, the attending physician, personally viewed and interpreted this ECG.  Normal sinus rate of 87 without any ST elevation or T wave inversions, normal intervals ____________________________________________  RADIOLOGY Robert Bellow, personally viewed and evaluated these images (plain radiographs) as part of my medical decision making, as well as reviewing the written report by the radiologist.  ED MD interpretation: No pneumonia Official radiology report(s): DG Chest 2 View  Result Date: 02/22/2021 CLINICAL DATA:  Shortness of breath EXAM: CHEST - 2 VIEW COMPARISON:  None. FINDINGS: The heart size and mediastinal contours are within normal limits. Both lungs are clear. No pleural effusion or pneumothorax. Dextroscoliosis lower thoracic spine. IMPRESSION: No acute process in the chest. Electronically Signed   By: Macy Mis M.D.   On: 02/22/2021 09:41    ____________________________________________   PROCEDURES  Procedure(s) performed (including Critical Care):  Procedures   ____________________________________________   INITIAL IMPRESSION / ASSESSMENT AND PLAN / ED COURSE   Ellisa Sealy was evaluated in Emergency Department on 02/22/2021 for the symptoms described in  the history of present illness. She was evaluated in the context of the global COVID-19 pandemic, which necessitated consideration that the patient might be at risk for infection with the SARS-CoV-2 virus that causes COVID-19. Institutional protocols and algorithms that pertain to the evaluation of patients at risk for COVID-19 are in a state of rapid change based on information released by regulatory bodies including the CDC and federal and state organizations. These policies and algorithms were followed during the patient's care in the ED.    Most Likely DDx:  -I suspect this is most likely from her asthma.  First cardiac marker was negative and her symptoms have been going on for greater than 3 hours       DDx that was also considered d/t potential to cause harm, but was found less likely based on history and physical (as detailed above): -PNA (no fevers, cough but CXR to evaluate) -PNX (reassured with equal b/l breath sounds, CXR to evaluate) -Symptomatic anemia (will get H&H) -Pulmonary embolism as no sob at rest, not pleuritic in nature, no hypoxia -Aortic Dissection as no tearing pain and no radiation to the  mid back, pulses equal -Pericarditis no rub on exam, EKG changes or hx to suggest dx -Tamponade (no notable SOB, tachycardic, hypotensive) -Esophageal rupture (no h/o diffuse vomitting/no crepitus)    11:25 AM reevaluated patient still has diffuse wheezing although looks more comfortable.  Patient states that her shortness of breath is mostly with walking.  We will get ambulatory sat.  Attempted to call patient's daughter on both the home and cell phone but no answers.  I was able to review patient's prior records from [facility] and she has a history of multiple episodes of admissions for asthma secondary to concurrent viral infections.  She had a CT PE back in March 2022 that was negative.  This seems very consistent with her prior episodes and given the significant amount of wheezing I  suspect that this is from her asthma exacerbation.  With patient's ambulation she is very short of breath with significant wheezing and heart rates go up into the 120s with elevated respiratory rates however her oxygen level stays at 95%.  However given this I feel that given her frequent asthma exacerbation to be best to watch her in the hospital overnight.  Will discuss with hospital team for admission.  Patient is noted to try to call the emergency room and I have attempted to call back this different number but nobody picked up and continue to ring.  I now made 3 different attempts to call daughter at these 3 different numbers but unfortunately have not gotten a hold of her.  However I have updated patient that she will be admitted to the hospital.   ____________________________________________   FINAL CLINICAL IMPRESSION(S) / ED DIAGNOSES   Final diagnoses:  Moderate asthma with exacerbation, unspecified whether persistent     MEDICATIONS GIVEN DURING THIS VISIT:  Medications  methylPREDNISolone sodium succinate (SOLU-MEDROL) 125 mg/2 mL injection 125 mg (125 mg Intravenous Given 02/22/21 1029)  ipratropium-albuterol (DUONEB) 0.5-2.5 (3) MG/3ML nebulizer solution 3 mL (3 mLs Nebulization Given 02/22/21 1033)  ipratropium-albuterol (DUONEB) 0.5-2.5 (3) MG/3ML nebulizer solution 3 mL (3 mLs Nebulization Given 02/22/21 1033)  ipratropium-albuterol (DUONEB) 0.5-2.5 (3) MG/3ML nebulizer solution 3 mL (3 mLs Nebulization Given 02/22/21 1033)     ED Discharge Orders     None        Note:  This document was prepared using Dragon voice recognition software and may include unintentional dictation errors.    Vanessa McDonald, MD 02/22/21 1330

## 2021-02-22 NOTE — ED Triage Notes (Signed)
Pt comes into the ED via EMS from home with c/o chest pain since between 2-3am today, SOB with cough.. pt has audible wheezing on arrival  ASA 324mg  Nitro spray x1 5/10 92HR 97%RA 148/92 CBG210 97.6 temp #18gLFA 249ml NS infused PTA

## 2021-02-23 ENCOUNTER — Encounter: Payer: Self-pay | Admitting: Family Medicine

## 2021-02-23 ENCOUNTER — Other Ambulatory Visit: Payer: Self-pay

## 2021-02-23 DIAGNOSIS — D75839 Thrombocytosis, unspecified: Secondary | ICD-10-CM | POA: Diagnosis present

## 2021-02-23 DIAGNOSIS — Z6828 Body mass index (BMI) 28.0-28.9, adult: Secondary | ICD-10-CM | POA: Diagnosis not present

## 2021-02-23 DIAGNOSIS — E860 Dehydration: Secondary | ICD-10-CM | POA: Diagnosis present

## 2021-02-23 DIAGNOSIS — E782 Mixed hyperlipidemia: Secondary | ICD-10-CM | POA: Diagnosis present

## 2021-02-23 DIAGNOSIS — D649 Anemia, unspecified: Secondary | ICD-10-CM | POA: Diagnosis present

## 2021-02-23 DIAGNOSIS — J4531 Mild persistent asthma with (acute) exacerbation: Secondary | ICD-10-CM | POA: Diagnosis not present

## 2021-02-23 DIAGNOSIS — J449 Chronic obstructive pulmonary disease, unspecified: Secondary | ICD-10-CM | POA: Diagnosis present

## 2021-02-23 DIAGNOSIS — J440 Chronic obstructive pulmonary disease with acute lower respiratory infection: Secondary | ICD-10-CM | POA: Diagnosis present

## 2021-02-23 DIAGNOSIS — K219 Gastro-esophageal reflux disease without esophagitis: Secondary | ICD-10-CM | POA: Diagnosis present

## 2021-02-23 DIAGNOSIS — N179 Acute kidney failure, unspecified: Secondary | ICD-10-CM | POA: Diagnosis present

## 2021-02-23 DIAGNOSIS — Z79899 Other long term (current) drug therapy: Secondary | ICD-10-CM | POA: Diagnosis not present

## 2021-02-23 DIAGNOSIS — R739 Hyperglycemia, unspecified: Secondary | ICD-10-CM | POA: Diagnosis present

## 2021-02-23 DIAGNOSIS — F0390 Unspecified dementia without behavioral disturbance: Secondary | ICD-10-CM | POA: Diagnosis not present

## 2021-02-23 DIAGNOSIS — I10 Essential (primary) hypertension: Secondary | ICD-10-CM | POA: Diagnosis present

## 2021-02-23 DIAGNOSIS — Z20822 Contact with and (suspected) exposure to covid-19: Secondary | ICD-10-CM | POA: Diagnosis present

## 2021-02-23 DIAGNOSIS — Z23 Encounter for immunization: Secondary | ICD-10-CM | POA: Diagnosis present

## 2021-02-23 DIAGNOSIS — Z7951 Long term (current) use of inhaled steroids: Secondary | ICD-10-CM | POA: Diagnosis not present

## 2021-02-23 DIAGNOSIS — J209 Acute bronchitis, unspecified: Secondary | ICD-10-CM | POA: Diagnosis present

## 2021-02-23 DIAGNOSIS — J441 Chronic obstructive pulmonary disease with (acute) exacerbation: Secondary | ICD-10-CM | POA: Diagnosis present

## 2021-02-23 DIAGNOSIS — F03A0 Unspecified dementia, mild, without behavioral disturbance, psychotic disturbance, mood disturbance, and anxiety: Secondary | ICD-10-CM | POA: Diagnosis present

## 2021-02-23 LAB — CBC
HCT: 41.8 % (ref 36.0–46.0)
Hemoglobin: 14 g/dL (ref 12.0–15.0)
MCH: 28.9 pg (ref 26.0–34.0)
MCHC: 33.5 g/dL (ref 30.0–36.0)
MCV: 86.2 fL (ref 80.0–100.0)
Platelets: 561 10*3/uL — ABNORMAL HIGH (ref 150–400)
RBC: 4.85 MIL/uL (ref 3.87–5.11)
RDW: 13 % (ref 11.5–15.5)
WBC: 21.8 10*3/uL — ABNORMAL HIGH (ref 4.0–10.5)
nRBC: 0 % (ref 0.0–0.2)

## 2021-02-23 LAB — COMPREHENSIVE METABOLIC PANEL
ALT: 16 U/L (ref 0–44)
AST: 20 U/L (ref 15–41)
Albumin: 3.7 g/dL (ref 3.5–5.0)
Alkaline Phosphatase: 51 U/L (ref 38–126)
Anion gap: 9 (ref 5–15)
BUN: 14 mg/dL (ref 8–23)
CO2: 29 mmol/L (ref 22–32)
Calcium: 9.1 mg/dL (ref 8.9–10.3)
Chloride: 99 mmol/L (ref 98–111)
Creatinine, Ser: 0.83 mg/dL (ref 0.44–1.00)
GFR, Estimated: >60 mL/min (ref >60)
Glucose, Bld: 122 mg/dL — ABNORMAL HIGH (ref 70–99)
Potassium: 3.7 mmol/L (ref 3.5–5.1)
Sodium: 137 mmol/L (ref 135–145)
Total Bilirubin: 0.6 mg/dL (ref 0.3–1.2)
Total Protein: 7.5 g/dL (ref 6.5–8.1)

## 2021-02-23 LAB — APTT: aPTT: 32 seconds (ref 24–36)

## 2021-02-23 LAB — PHOSPHORUS: Phosphorus: 2.6 mg/dL (ref 2.5–4.6)

## 2021-02-23 LAB — HIV ANTIBODY (ROUTINE TESTING W REFLEX): HIV Screen 4th Generation wRfx: NONREACTIVE

## 2021-02-23 LAB — MAGNESIUM: Magnesium: 1.5 mg/dL — ABNORMAL LOW (ref 1.7–2.4)

## 2021-02-23 MED ORDER — MOMETASONE FURO-FORMOTEROL FUM 100-5 MCG/ACT IN AERO
2.0000 | INHALATION_SPRAY | Freq: Two times a day (BID) | RESPIRATORY_TRACT | Status: DC
  Administered 2021-02-23 – 2021-02-25 (×4): 2 via RESPIRATORY_TRACT
  Filled 2021-02-23 (×2): qty 8.8

## 2021-02-23 MED ORDER — DOXYCYCLINE HYCLATE 100 MG PO TABS
100.0000 mg | ORAL_TABLET | Freq: Two times a day (BID) | ORAL | Status: DC
  Administered 2021-02-23 – 2021-02-25 (×5): 100 mg via ORAL
  Filled 2021-02-23 (×5): qty 1

## 2021-02-23 MED ORDER — PANTOPRAZOLE SODIUM 40 MG PO TBEC
40.0000 mg | DELAYED_RELEASE_TABLET | Freq: Every day | ORAL | Status: DC
  Administered 2021-02-23 – 2021-02-25 (×3): 40 mg via ORAL
  Filled 2021-02-23 (×3): qty 1

## 2021-02-23 MED ORDER — AMLODIPINE BESYLATE 10 MG PO TABS
10.0000 mg | ORAL_TABLET | Freq: Every day | ORAL | Status: DC
  Administered 2021-02-23 – 2021-02-25 (×3): 10 mg via ORAL
  Filled 2021-02-23: qty 1
  Filled 2021-02-23: qty 2
  Filled 2021-02-23: qty 1

## 2021-02-23 MED ORDER — METHYLPREDNISOLONE SODIUM SUCC 40 MG IJ SOLR
40.0000 mg | Freq: Two times a day (BID) | INTRAMUSCULAR | Status: DC
  Administered 2021-02-23 – 2021-02-25 (×5): 40 mg via INTRAVENOUS
  Filled 2021-02-23 (×5): qty 1

## 2021-02-23 MED ORDER — PNEUMOCOCCAL VAC POLYVALENT 25 MCG/0.5ML IJ INJ
0.5000 mL | INJECTION | INTRAMUSCULAR | Status: AC
  Administered 2021-02-24: 10:00:00 0.5 mL via INTRAMUSCULAR
  Filled 2021-02-23: qty 0.5

## 2021-02-23 MED ORDER — MAGNESIUM SULFATE 2 GM/50ML IV SOLN
2.0000 g | Freq: Once | INTRAVENOUS | Status: AC
  Administered 2021-02-23: 20:00:00 2 g via INTRAVENOUS
  Filled 2021-02-23: qty 50

## 2021-02-23 MED ORDER — INFLUENZA VAC A&B SA ADJ QUAD 0.5 ML IM PRSY
0.5000 mL | PREFILLED_SYRINGE | INTRAMUSCULAR | Status: AC
  Administered 2021-02-25: 09:00:00 0.5 mL via INTRAMUSCULAR
  Filled 2021-02-23: qty 0.5

## 2021-02-23 NOTE — ED Notes (Signed)
Pt alert, remains on RA, oriented to self and situation, disoriented to date and year.

## 2021-02-23 NOTE — ED Notes (Signed)
Gregary Signs RN aware of assigned bed

## 2021-02-23 NOTE — Progress Notes (Addendum)
PROGRESS NOTE   Alyssa Santiago  HEN:[id number] DOB: 09-Jan-1951 DOA: 02/22/2021 PCP: Vladimir Crofts, MD  Brief Narrative:  70 year old black female known severe asthma COPD overlap--has had 5 prednisone courses 2022 [follows with Dr. Kirke Shaggy [facility] 11/2019 severe fixed obstruction significant bronchodilator response], HTN HLD thoracic scoliosis BMI 28 mild dementia  Came to [facility] emergency room with wheezing 12/22-work-up showed no acute process on x-ray, WBC mild leukocytosis  Admitted for acute exacerbation of underlying severe asthma/COPD  Hospital-Problem based course  Acute exacerbation COPD Gold stage III-IV Symptomatically better no oxygen requirement but still wheezing Admission complicated by the fact that she does not remember names of her inhalers-I am not sure if her compliance Continue Solu-Medrol lower dose to 40 every 12 Continue DuoNeb every 4 as needed with every 6 mandatory, continue Singulair, continue budesonide neb twice daily Will add long-acting Dulera, add doxycycline 100 twice daily for pulmonary toilet for 3 days Will need follow-up with outpatient pulmonology regarding eosinophilia I have asked RT to teach the patient when to use various inhaled HTN Moderately controlled continue losartan HCTZ Mild sinus tachycardia probably related to albuterol No concerns for sepsis at this time--White count is elevated from high-dose steroids given early Mild underlying cognitive issues Lives with daughter at home who I called could not reach on voicemail It may be a good idea for her to come and learn how to utilize inhalers as a backup plan   DVT prophylaxis: lovenox Code Status: full Family Communication: called Gaspar Bidding [phone] Disposition:  Status is: Observation  The patient will require care spanning > 2 midnights and should be moved to inpatient because:  Still wheezing and poor comprehension of inhaler technique      Consultants:  None  currently  Procedures: No  Antimicrobials: None at this time   Subjective: Awake alert pleasant finished breakfast Nursing notes ambulated down the hallway without any distress shortness of breath She is still actively wheezing however at the bedside She does not remember her inhalers what she takes and when she takes them-it seems she defers to her daughter for care  Objective: Vitals:   02/23/21 0230 02/23/21 0300 02/23/21 0330 02/23/21 0700  BP: (!) 122/92 133/84 (!) 142/96 (!) 150/87  Pulse: 93 81 90 (!) 103  Resp:   (!) 23 16  Temp:    98.2 F (36.8 C)  TempSrc:    Oral  SpO2: 95% 94% 96% 96%  Weight:      Height:       No intake or output data in the 24 hours ending 02/23/21 0831 Filed Weights   02/22/21 0855  Weight: 82.6 kg    Examination:  Pleasant alert cooperative-- Time date year but seems to have some memory issues Moderate dentition pupils equally reactive Coarse wheeze throughout all lung fields S1-S2 no murmur Abdomen soft moderate guarding Neurologically intact moving all 4 limbs equally  Data Reviewed: personally reviewed   CBC    Component Value Date/Time   WBC 21.8 (H) 02/23/2021 0655   RBC 4.85 02/23/2021 0655   HGB 14.0 02/23/2021 0655   HCT 41.8 02/23/2021 0655   PLT 561 (H) 02/23/2021 0655   MCV 86.2 02/23/2021 0655   MCH 28.9 02/23/2021 0655   MCHC 33.5 02/23/2021 0655   RDW 13.0 02/23/2021 0655   CMP Latest Ref Rng & Units 02/23/2021 02/22/2021 03/10/2019  Glucose 70 - 99 mg/dL 122(H) 118(H) -  BUN 8 - 23 mg/dL 14 5(L) -  Creatinine 0.44 -  1.00 mg/dL 0.83 0.81 0.90  Sodium 135 - 145 mmol/L 137 139 -  Potassium 3.5 - 5.1 mmol/L 3.7 3.6 -  Chloride 98 - 111 mmol/L 99 104 -  CO2 22 - 32 mmol/L 29 27 -  Calcium 8.9 - 10.3 mg/dL 9.1 8.7(L) -  Total Protein 6.5 - 8.1 g/dL 7.5 - -  Total Bilirubin 0.3 - 1.2 mg/dL 0.6 - -  Alkaline Phos 38 - 126 U/L 51 - -  AST 15 - 41 U/L 20 - -  ALT 0 - 44 U/L 16 - -     Radiology  Studies: DG Chest 2 View  Result Date: 02/22/2021 CLINICAL DATA:  Shortness of breath EXAM: CHEST - 2 VIEW COMPARISON:  None. FINDINGS: The heart size and mediastinal contours are within normal limits. Both lungs are clear. No pleural effusion or pneumothorax. Dextroscoliosis lower thoracic spine. IMPRESSION: No acute process in the chest. Electronically Signed   By: Macy Mis M.D.   On: 02/22/2021 09:41     Scheduled Meds:  amLODipine  10 mg Oral Daily   atorvastatin  40 mg Oral Daily   azithromycin  250 mg Oral Daily   dextromethorphan-guaiFENesin  1 tablet Oral BID   doxycycline  100 mg Oral Q12H   enoxaparin (LOVENOX) injection  40 mg Subcutaneous Q24H   losartan  100 mg Oral Daily   And   hydrochlorothiazide  25 mg Oral Daily   ipratropium-albuterol  3 mL Nebulization Q6H   methylPREDNISolone (SOLU-MEDROL) injection  40 mg Intravenous Q12H   mometasone-formoterol  2 puff Inhalation BID   montelukast  10 mg Oral Daily   pantoprazole  40 mg Oral Daily   Continuous Infusions:   LOS: 0 days   Time spent: 62  Nita Sells, MD [facility] To contact the attending provider between 7A-7P or the covering provider during after hours 7P-7A, please log into the web site [email or web address] and access using universal [place] password for that web site. If you do not have the password, please call the hospital operator.  02/23/2021, 8:31 AM

## 2021-02-23 NOTE — ED Notes (Signed)
Up to bathroom ambulatory.  Says she does not feel dizzy this am.  Says she feels better than last night.  Steady on feet.

## 2021-02-24 DIAGNOSIS — J449 Chronic obstructive pulmonary disease, unspecified: Secondary | ICD-10-CM

## 2021-02-24 DIAGNOSIS — F0390 Unspecified dementia without behavioral disturbance: Secondary | ICD-10-CM

## 2021-02-24 LAB — COMPREHENSIVE METABOLIC PANEL
ALT: 12 U/L (ref 0–44)
AST: 19 U/L (ref 15–41)
Albumin: 3.2 g/dL — ABNORMAL LOW (ref 3.5–5.0)
Alkaline Phosphatase: 44 U/L (ref 38–126)
Anion gap: 12 (ref 5–15)
BUN: 34 mg/dL — ABNORMAL HIGH (ref 8–23)
CO2: 24 mmol/L (ref 22–32)
Calcium: 9.1 mg/dL (ref 8.9–10.3)
Chloride: 98 mmol/L (ref 98–111)
Creatinine, Ser: 1.55 mg/dL — ABNORMAL HIGH (ref 0.44–1.00)
GFR, Estimated: 36 mL/min — ABNORMAL LOW (ref >60)
Glucose, Bld: 202 mg/dL — ABNORMAL HIGH (ref 70–99)
Potassium: 3.8 mmol/L (ref 3.5–5.1)
Sodium: 134 mmol/L — ABNORMAL LOW (ref 135–145)
Total Bilirubin: 0.6 mg/dL (ref 0.3–1.2)
Total Protein: 6.6 g/dL (ref 6.5–8.1)

## 2021-02-24 LAB — CBC WITH DIFFERENTIAL/PLATELET
Abs Immature Granulocytes: 0.33 10*3/uL — ABNORMAL HIGH (ref 0.00–0.07)
Basophils Absolute: 0.1 10*3/uL (ref 0.0–0.1)
Basophils Relative: 0 %
Eosinophils Absolute: 0 10*3/uL (ref 0.0–0.5)
Eosinophils Relative: 0 %
HCT: 37.6 % (ref 36.0–46.0)
Hemoglobin: 12.7 g/dL (ref 12.0–15.0)
Immature Granulocytes: 1 %
Lymphocytes Relative: 4 %
Lymphs Abs: 1.2 10*3/uL (ref 0.7–4.0)
MCH: 28.9 pg (ref 26.0–34.0)
MCHC: 33.8 g/dL (ref 30.0–36.0)
MCV: 85.6 fL (ref 80.0–100.0)
Monocytes Absolute: 0.4 10*3/uL (ref 0.1–1.0)
Monocytes Relative: 1 %
Neutro Abs: 26.1 10*3/uL — ABNORMAL HIGH (ref 1.7–7.7)
Neutrophils Relative %: 94 %
Platelets: 543 10*3/uL — ABNORMAL HIGH (ref 150–400)
RBC: 4.39 MIL/uL (ref 3.87–5.11)
RDW: 13.3 % (ref 11.5–15.5)
Smear Review: NORMAL
WBC: 28.1 10*3/uL — ABNORMAL HIGH (ref 4.0–10.5)
nRBC: 0 % (ref 0.0–0.2)

## 2021-02-24 LAB — CREATININE, SERUM
Creatinine, Ser: 1.4 mg/dL — ABNORMAL HIGH (ref 0.44–1.00)
GFR, Estimated: 41 mL/min — ABNORMAL LOW (ref >60)

## 2021-02-24 LAB — MAGNESIUM: Magnesium: 2.1 mg/dL (ref 1.7–2.4)

## 2021-02-24 MED ORDER — SODIUM CHLORIDE 0.9 % IV SOLN: INTRAVENOUS | Status: DC

## 2021-02-24 NOTE — Progress Notes (Signed)
PROGRESS NOTE    Alyssa Santiago  OXB:[id number] DOB: Sep 10, 1950 DOA: 02/22/2021 PCP: Vladimir Crofts, MD   Chief Complaint  Patient presents with   Shortness of Breath   Chest Pain    Brief Narrative:  70 year old lady with prior history of asthma and COPD presents with wheezing. Chest x-ray was negative for acute pneumonia.  She was admitted for acute COPD exacerbation. Assessment & Plan:   Principal Problem:   Asthma exacerbation Active Problems:   Thrombocytosis   Leukocytosis   Hyperglycemia   Essential hypertension   Mixed hyperlipidemia   GERD (gastroesophageal reflux disease)   Dementia (HCC)   COPD (chronic obstructive pulmonary disease) (HCC)   Acute COPD exacerbation wheezing has improved, patient weaned to room air. Continue with bronchodilators and doxycycline for acute bronchitis. Recommend outpatient follow-up with pulmonology regarding eosinophilia.     Mild AKI probably secondary to dehydration Stop losartan and hydrochlorothiazide. Repeat BMP showed improvement in creatinine. Start the patient on gentle hydration and repeat renal parameters in the morning   Hypertension Blood pressure parameters have been borderline low yesterday Holding losartan and hydrochlorothiazide.  GERD Stable.   Dementia without any behavioral abnormalities   Stable    DVT prophylaxis: (Lovenox) Code Status: Full code.  Family Communication: family at bedside.  Disposition:   Status is: Inpatient  Remains inpatient appropriate because: AKI       Consultants:  NONE.  Procedures: none.   Antimicrobials: doxycycline.    Subjective: Feeling much better.   Objective: Vitals:   02/24/21 0724 02/24/21 0758 02/24/21 1141 02/24/21 1425  BP: 116/75  105/69   Pulse: 100  94   Resp: 18  18   Temp: 98 F (36.7 C)  98.4 F (36.9 C)   TempSrc:   Oral   SpO2: 98% 97% 97% 96%  Weight:      Height:       No intake or output data in the 24  hours ending 02/24/21 1524 Filed Weights   02/22/21 0855 02/23/21 1444  Weight: 82.6 kg 83 kg    Examination:  General exam: Appears calm and comfortable  Respiratory system: diminished air entry through out the lungs.  Cardiovascular system: S1 & S2 heard, RRR. No JVD,  No pedal edema. Gastrointestinal system: Abdomen is nondistended, soft and nontender.   Normal bowel sounds heard. Central nervous system: Alert and oriented to person and place only. . Extremities: Symmetric 5 x 5 power. Skin: No rashes, lesions or ulcers Psychiatry:Mood & affect appropriate.     Data Reviewed: I have personally reviewed following labs and imaging studies  CBC: Recent Labs  Lab 02/22/21 0904 02/23/21 0655 02/24/21 0436  WBC 11.4* 21.8* 28.1*  NEUTROABS  --   --  26.1*  HGB 15.3* 14.0 12.7  HCT 46.4* 41.8 37.6  MCV 87.1 86.2 85.6  PLT 444* 561* 543*    Basic Metabolic Panel: Recent Labs  Lab 02/22/21 0904 02/23/21 0655 02/24/21 0436 02/24/21 1237  NA 139 137 134*  --   K 3.6 3.7 3.8  --   CL 104 99 98  --   CO2 27 29 24   --   GLUCOSE 118* 122* 202*  --   BUN 5* 14 34*  --   CREATININE 0.81 0.83 1.55* 1.40*  CALCIUM 8.7* 9.1 9.1  --   MG  --  1.5*  --  2.1  PHOS  --  2.6  --   --     GFR: Estimated Creatinine  Clearance: 42 mL/min (A) (by C-G formula based on SCr of 1.4 mg/dL (H)).  Liver Function Tests: Recent Labs  Lab 02/23/21 0655 02/24/21 0436  AST 20 19  ALT 16 12  ALKPHOS 51 44  BILITOT 0.6 0.6  PROT 7.5 6.6  ALBUMIN 3.7 3.2*    CBG: No results for input(s): GLUCAP in the last 168 hours.   Recent Results (from the past 240 hour(s))  Resp Panel by RT-PCR (Flu A&B, Covid) Nasopharyngeal Swab     Status: None   Collection Time: 02/22/21  9:04 AM   Specimen: Nasopharyngeal Swab; Nasopharyngeal(NP) swabs in vial transport medium  Result Value Ref Range Status   SARS Coronavirus 2 by RT PCR NEGATIVE NEGATIVE Final    Comment: (NOTE) SARS-CoV-2 target  nucleic acids are NOT DETECTED.  The SARS-CoV-2 RNA is generally detectable in upper respiratory specimens during the acute phase of infection. The lowest concentration of SARS-CoV-2 viral copies this assay can detect is 138 copies/mL. A negative result does not preclude SARS-Cov-2 infection and should not be used as the sole basis for treatment or other patient management decisions. A negative result may occur with  improper specimen collection/handling, submission of specimen other than nasopharyngeal swab, presence of viral mutation(s) within the areas targeted by this assay, and inadequate number of viral copies(<138 copies/mL). A negative result must be combined with clinical observations, patient history, and epidemiological information. The expected result is Negative.  Fact Sheet for Patients:  EntrepreneurPulse.com.au  Fact Sheet for Healthcare Providers:  IncredibleEmployment.be  This test is no t yet approved or cleared by the [place] FDA and  has been authorized for detection and/or diagnosis of SARS-CoV-2 by FDA under an Emergency Use Authorization (EUA). This EUA will remain  in effect (meaning this test can be used) for the duration of the COVID-19 declaration under Section 564(b)(1) of the Act, 21 U.S.C.section 360bbb-3(b)(1), unless the authorization is terminated  or revoked sooner.       Influenza A by PCR NEGATIVE NEGATIVE Final   Influenza B by PCR NEGATIVE NEGATIVE Final    Comment: (NOTE) The Xpert Xpress SARS-CoV-2/FLU/RSV plus assay is intended as an aid in the diagnosis of influenza from Nasopharyngeal swab specimens and should not be used as a sole basis for treatment. Nasal washings and aspirates are unacceptable for Xpert Xpress SARS-CoV-2/FLU/RSV testing.  Fact Sheet for Patients: EntrepreneurPulse.com.au  Fact Sheet for Healthcare  Providers: IncredibleEmployment.be  This test is not yet approved or cleared by the [place] FDA and has been authorized for detection and/or diagnosis of SARS-CoV-2 by FDA under an Emergency Use Authorization (EUA). This EUA will remain in effect (meaning this test can be used) for the duration of the COVID-19 declaration under Section 564(b)(1) of the Act, 21 U.S.C. section 360bbb-3(b)(1), unless the authorization is terminated or revoked.  Performed at [facility], [address] [place], [place] [address]          Radiology Studies: No results found.      Scheduled Meds:  amLODipine  10 mg Oral Daily   atorvastatin  40 mg Oral Daily   dextromethorphan-guaiFENesin  1 tablet Oral BID   doxycycline  100 mg Oral Q12H   enoxaparin (LOVENOX) injection  40 mg Subcutaneous Q24H   influenza vaccine adjuvanted  0.5 mL Intramuscular Tomorrow-1000   ipratropium-albuterol  3 mL Nebulization Q6H   methylPREDNISolone (SOLU-MEDROL) injection  40 mg Intravenous Q12H   mometasone-formoterol  2 puff Inhalation BID   montelukast  10 mg Oral Daily   pantoprazole  40 mg Oral Daily   Continuous Infusions:  sodium chloride       LOS: 1 day       Hosie Poisson, MD [facility]   To contact the attending provider between 7A-7P or the covering provider during after hours 7P-7A, please log into the web site [email or web address] and access using universal [place] password for that web site. If you do not have the password, please call the hospital operator.  02/24/2021, 3:24 PM

## 2021-02-25 LAB — BASIC METABOLIC PANEL
Anion gap: 11 (ref 5–15)
BUN: 37 mg/dL — ABNORMAL HIGH (ref 8–23)
CO2: 25 mmol/L (ref 22–32)
Calcium: 8.9 mg/dL (ref 8.9–10.3)
Chloride: 100 mmol/L (ref 98–111)
Creatinine, Ser: 1.15 mg/dL — ABNORMAL HIGH (ref 0.44–1.00)
GFR, Estimated: 52 mL/min — ABNORMAL LOW (ref >60)
Glucose, Bld: 139 mg/dL — ABNORMAL HIGH (ref 70–99)
Potassium: 3.9 mmol/L (ref 3.5–5.1)
Sodium: 136 mmol/L (ref 135–145)

## 2021-02-25 MED ORDER — PREDNISONE 10 MG PO TABS: 40.0000 mg | ORAL_TABLET | Freq: Every day | ORAL | 0 refills | Status: AC

## 2021-02-25 MED ORDER — MOMETASONE FURO-FORMOTEROL FUM 100-5 MCG/ACT IN AERO: 2.0000 | INHALATION_SPRAY | Freq: Two times a day (BID) | RESPIRATORY_TRACT | 0 refills | Status: DC

## 2021-02-25 MED ORDER — DM-GUAIFENESIN ER 30-600 MG PO TB12: 1.0000 | ORAL_TABLET | Freq: Two times a day (BID) | ORAL | 0 refills | Status: AC

## 2021-02-25 MED ORDER — DOXYCYCLINE HYCLATE 100 MG PO TABS
100.0000 mg | ORAL_TABLET | Freq: Two times a day (BID) | ORAL | 0 refills | Status: AC
Start: 2021-02-25 — End: 2021-02-28

## 2021-02-25 NOTE — Discharge Summary (Signed)
Physician Discharge Summary  Alyssa Santiago NWG:[id number] DOB: 1950-06-21 DOA: 02/22/2021  PCP: Vladimir Crofts, MD  Admit date: 02/22/2021 Discharge date: 02/25/2021  Admitted From: Home.  Disposition:  Home.   Recommendations for Outpatient Follow-up:  Follow up with PCP in 1-2 weeks Please obtain BMP/CBC in one week Please follow up pulmonology as needed.  4. We stopped HCTZ - due to AKI. Please talk to PCP before resuming it.   Discharge Condition:stable.  CODE STATUS:full code.  Diet recommendation: Heart Healthy    Brief/Interim Summary: 70 year old lady with prior history of asthma and COPD presents with wheezing. Chest x-ray was negative for acute pneumonia.  She was admitted for acute COPD exacerbation.  Discharge Diagnoses:  Principal Problem:   Asthma exacerbation Active Problems:   Thrombocytosis   Leukocytosis   Hyperglycemia   Essential hypertension   Mixed hyperlipidemia   GERD (gastroesophageal reflux disease)   Dementia (HCC)   COPD (chronic obstructive pulmonary disease) (HCC)  Acute COPD exacerbation wheezing has improved, patient weaned to room air. Continue with bronchodilators and doxycycline for acute bronchitis. Recommend outpatient follow-up with pulmonology regarding eosinophilia.         Mild AKI probably secondary to dehydration Stop losartan and hydrochlorothiazide. Repeat BMP showed improvement in creatinine. Start the patient on gentle hydration , and resume amlodipine instead of the losartan and HCTZ.      Hypertension Blood pressure parameters improved.  Holding losartan and hydrochlorothiazide.   GERD Stable.    Dementia without any behavioral abnormalities     Stable  Discharge Instructions  Discharge Instructions     Diet - low sodium heart healthy   Complete by: As directed    Discharge instructions   Complete by: As directed    Please follow up with PCP in one week.      Allergies as of 02/25/2021    No Known Allergies      Medication List     STOP taking these medications    butalbital-acetaminophen-caffeine 50-325-40 MG tablet Commonly known as: FIORICET   calcium-vitamin D 500-200 MG-UNIT tablet Commonly known as: OSCAL WITH D   diazepam 2 MG tablet Commonly known as: Valium   fluticasone 220 MCG/ACT inhaler Commonly known as: FLOVENT HFA   fluticasone 50 MCG/ACT nasal spray Commonly known as: FLONASE   losartan-hydrochlorothiazide 100-25 MG tablet Commonly known as: HYZAAR   mometasone-formoterol 100-5 MCG/ACT Aero Commonly known as: DULERA       TAKE these medications    albuterol 108 (90 Base) MCG/ACT inhaler Commonly known as: VENTOLIN HFA Inhale 2 puffs into the lungs every 6 (six) hours as needed.   albuterol (2.5 MG/3ML) 0.083% nebulizer solution Commonly known as: PROVENTIL Take 2.5 mg by nebulization every 4 (four) hours as needed.   amLODipine 10 MG tablet Commonly known as: NORVASC Take 10 mg by mouth daily.   atorvastatin 40 MG tablet Commonly known as: LIPITOR Take 40 mg by mouth daily.   budesonide 0.5 MG/2ML nebulizer solution Commonly known as: PULMICORT Inhale into the lungs.   dextromethorphan-guaiFENesin 30-600 MG 12hr tablet Commonly known as: MUCINEX DM Take 1 tablet by mouth 2 (two) times daily.   doxycycline 100 MG tablet Commonly known as: VIBRA-TABS Take 1 tablet (100 mg total) by mouth every 12 (twelve) hours for 3 days.   montelukast 10 MG tablet Commonly known as: SINGULAIR Take 10 mg by mouth daily.   omeprazole 20 MG capsule Commonly known as: PRILOSEC Take 20 mg by mouth daily.  predniSONE 10 MG tablet Commonly known as: DELTASONE Take 4 tablets (40 mg total) by mouth daily for 4 days. Start taking on: February 26, 2021        Follow-up Information     Vladimir Crofts, MD Follow up in 1 week(s).   Specialty: Neurology Contact information: [facility]  [facility] [place] [address] [phone]                No Known Allergies  Consultations: None.    Procedures/Studies: DG Chest 2 View  Result Date: 02/22/2021 CLINICAL DATA:  Shortness of breath EXAM: CHEST - 2 VIEW COMPARISON:  None. FINDINGS: The heart size and mediastinal contours are within normal limits. Both lungs are clear. No pleural effusion or pneumothorax. Dextroscoliosis lower thoracic spine. IMPRESSION: No acute process in the chest. Electronically Signed   By: Macy Mis M.D.   On: 02/22/2021 09:41      Subjective: No new complaints.   Discharge Exam: Vitals:   02/25/21 1245 02/25/21 1347  BP: 136/84   Pulse: 95   Resp: 16   Temp:    SpO2: 100% 99%   Vitals:   02/25/21 0735 02/25/21 0818 02/25/21 1245 02/25/21 1347  BP:  133/74 136/84   Pulse:  97 95   Resp:  18 16   Temp:  98.1 F (36.7 C)    TempSrc:  Oral    SpO2: 94% 99% 100% 99%  Weight:      Height:        General: Pt is alert, awake, not in acute distress Cardiovascular: RRR, S1/S2 +, no rubs, no gallops Respiratory: CTA bilaterally, no wheezing, no rhonchi Abdominal: Soft, NT, ND, bowel sounds + Extremities: no edema, no cyanosis    The results of significant diagnostics from this hospitalization (including imaging, microbiology, ancillary and laboratory) are listed below for reference.     Microbiology: Recent Results (from the past 240 hour(s))  Resp Panel by RT-PCR (Flu A&B, Covid) Nasopharyngeal Swab     Status: None   Collection Time: 02/22/21  9:04 AM   Specimen: Nasopharyngeal Swab; Nasopharyngeal(NP) swabs in vial transport medium  Result Value Ref Range Status   SARS Coronavirus 2 by RT PCR NEGATIVE NEGATIVE Final    Comment: (NOTE) SARS-CoV-2 target nucleic acids are NOT DETECTED.  The SARS-CoV-2 RNA is generally detectable in upper respiratory specimens during the acute phase of infection. The lowest concentration of SARS-CoV-2 viral copies this  assay can detect is 138 copies/mL. A negative result does not preclude SARS-Cov-2 infection and should not be used as the sole basis for treatment or other patient management decisions. A negative result may occur with  improper specimen collection/handling, submission of specimen other than nasopharyngeal swab, presence of viral mutation(s) within the areas targeted by this assay, and inadequate number of viral copies(<138 copies/mL). A negative result must be combined with clinical observations, patient history, and epidemiological information. The expected result is Negative.  Fact Sheet for Patients:  EntrepreneurPulse.com.au  Fact Sheet for Healthcare Providers:  IncredibleEmployment.be  This test is no t yet approved or cleared by the [place] FDA and  has been authorized for detection and/or diagnosis of SARS-CoV-2 by FDA under an Emergency Use Authorization (EUA). This EUA will remain  in effect (meaning this test can be used) for the duration of the COVID-19 declaration under Section 564(b)(1) of the Act, 21 U.S.C.section 360bbb-3(b)(1), unless the authorization is terminated  or revoked sooner.  Influenza A by PCR NEGATIVE NEGATIVE Final   Influenza B by PCR NEGATIVE NEGATIVE Final    Comment: (NOTE) The Xpert Xpress SARS-CoV-2/FLU/RSV plus assay is intended as an aid in the diagnosis of influenza from Nasopharyngeal swab specimens and should not be used as a sole basis for treatment. Nasal washings and aspirates are unacceptable for Xpert Xpress SARS-CoV-2/FLU/RSV testing.  Fact Sheet for Patients: EntrepreneurPulse.com.au  Fact Sheet for Healthcare Providers: IncredibleEmployment.be  This test is not yet approved or cleared by the [place] FDA and has been authorized for detection and/or diagnosis of SARS-CoV-2 by FDA under an Emergency Use Authorization (EUA). This EUA will  remain in effect (meaning this test can be used) for the duration of the COVID-19 declaration under Section 564(b)(1) of the Act, 21 U.S.C. section 360bbb-3(b)(1), unless the authorization is terminated or revoked.  Performed at [facility], [address] [place], [place] [address]      Labs: BNP (last 3 results) Recent Labs    02/22/21 0904  BNP 6.9   Basic Metabolic Panel: Recent Labs  Lab 02/22/21 0904 02/23/21 0655 02/24/21 0436 02/24/21 1237 02/25/21 0405  NA 139 137 134*  --  136  K 3.6 3.7 3.8  --  3.9  CL 104 99 98  --  100  CO2 27 29 24   --  25  GLUCOSE 118* 122* 202*  --  139*  BUN 5* 14 34*  --  37*  CREATININE 0.81 0.83 1.55* 1.40* 1.15*  CALCIUM 8.7* 9.1 9.1  --  8.9  MG  --  1.5*  --  2.1  --   PHOS  --  2.6  --   --   --    Liver Function Tests: Recent Labs  Lab 02/23/21 0655 02/24/21 0436  AST 20 19  ALT 16 12  ALKPHOS 51 44  BILITOT 0.6 0.6  PROT 7.5 6.6  ALBUMIN 3.7 3.2*   No results for input(s): LIPASE, AMYLASE in the last 168 hours. No results for input(s): AMMONIA in the last 168 hours. CBC: Recent Labs  Lab 02/22/21 0904 02/23/21 0655 02/24/21 0436  WBC 11.4* 21.8* 28.1*  NEUTROABS  --   --  26.1*  HGB 15.3* 14.0 12.7  HCT 46.4* 41.8 37.6  MCV 87.1 86.2 85.6  PLT 444* 561* 543*   Cardiac Enzymes: No results for input(s): CKTOTAL, CKMB, CKMBINDEX, TROPONINI in the last 168 hours. BNP: Invalid input(s): POCBNP CBG: No results for input(s): GLUCAP in the last 168 hours. D-Dimer No results for input(s): DDIMER in the last 72 hours. Hgb A1c No results for input(s): HGBA1C in the last 72 hours. Lipid Profile No results for input(s): CHOL, HDL, LDLCALC, TRIG, CHOLHDL, LDLDIRECT in the last 72 hours. Thyroid function studies No results for input(s): TSH, T4TOTAL, T3FREE, THYROIDAB in the last 72 hours.  Invalid input(s): FREET3 Anemia work up No results for input(s): VITAMINB12, FOLATE, FERRITIN, TIBC, IRON,  RETICCTPCT in the last 72 hours. Urinalysis No results found for: COLORURINE, APPEARANCEUR, [place], [place], [place], [place], [place], [place], PROTEINUR, UROBILINOGEN, NITRITE, LEUKOCYTESUR Sepsis Labs Invalid input(s): PROCALCITONIN,  WBC,  LACTICIDVEN Microbiology Recent Results (from the past 240 hour(s))  Resp Panel by RT-PCR (Flu A&B, Covid) Nasopharyngeal Swab     Status: None   Collection Time: 02/22/21  9:04 AM   Specimen: Nasopharyngeal Swab; Nasopharyngeal(NP) swabs in vial transport medium  Result Value Ref Range Status   SARS Coronavirus 2 by RT PCR NEGATIVE NEGATIVE Final  Comment: (NOTE) SARS-CoV-2 target nucleic acids are NOT DETECTED.  The SARS-CoV-2 RNA is generally detectable in upper respiratory specimens during the acute phase of infection. The lowest concentration of SARS-CoV-2 viral copies this assay can detect is 138 copies/mL. A negative result does not preclude SARS-Cov-2 infection and should not be used as the sole basis for treatment or other patient management decisions. A negative result may occur with  improper specimen collection/handling, submission of specimen other than nasopharyngeal swab, presence of viral mutation(s) within the areas targeted by this assay, and inadequate number of viral copies(<138 copies/mL). A negative result must be combined with clinical observations, patient history, and epidemiological information. The expected result is Negative.  Fact Sheet for Patients:  EntrepreneurPulse.com.au  Fact Sheet for Healthcare Providers:  IncredibleEmployment.be  This test is no t yet approved or cleared by the [place] FDA and  has been authorized for detection and/or diagnosis of SARS-CoV-2 by FDA under an Emergency Use Authorization (EUA). This EUA will remain  in effect (meaning this test can be used) for the duration of the COVID-19 declaration under Section 564(b)(1) of the Act,  21 U.S.C.section 360bbb-3(b)(1), unless the authorization is terminated  or revoked sooner.       Influenza A by PCR NEGATIVE NEGATIVE Final   Influenza B by PCR NEGATIVE NEGATIVE Final    Comment: (NOTE) The Xpert Xpress SARS-CoV-2/FLU/RSV plus assay is intended as an aid in the diagnosis of influenza from Nasopharyngeal swab specimens and should not be used as a sole basis for treatment. Nasal washings and aspirates are unacceptable for Xpert Xpress SARS-CoV-2/FLU/RSV testing.  Fact Sheet for Patients: EntrepreneurPulse.com.au  Fact Sheet for Healthcare Providers: IncredibleEmployment.be  This test is not yet approved or cleared by the [place] FDA and has been authorized for detection and/or diagnosis of SARS-CoV-2 by FDA under an Emergency Use Authorization (EUA). This EUA will remain in effect (meaning this test can be used) for the duration of the COVID-19 declaration under Section 564(b)(1) of the Act, 21 U.S.C. section 360bbb-3(b)(1), unless the authorization is terminated or revoked.  Performed at [facility], [address] [place], [place] [address]      Time coordinating discharge: 36 minutes.   SIGNED:   Hosie Poisson, MD  [facility] 02/25/2021, 3:19 PM

## 2021-02-25 NOTE — TOC Transition Note (Signed)
Transition of Care [facility]) - CM/SW Discharge Note   Patient Details  Name: Alyssa Santiago MRN: [id number] Date of Birth: 09-22-1950  [facility] CM/SW Contact:  Izola Price, RN Phone Number: 02/25/2021, 11:16 AM   Clinical Narrative:  Patient being discharged to Home/Self Care. No therapy evaluations/recommendations or DME ordered. Simmie Davies RN CM     Final next level of care: Home/Self Care Barriers to Discharge: Barriers Resolved   Patient Goals and CMS Choice        Discharge Placement                       Discharge Plan and Services                DME Arranged: N/A DME Agency: NA       HH Arranged: NA HH Agency: NA        Social Determinants of Health (SDOH) Interventions     Readmission Risk Interventions No flowsheet data found.

## 2021-02-25 NOTE — Progress Notes (Signed)
Patient is being discharged home.  Discharge papers given and explained to Javier Glazier, patient's daughter.  She verbalized understanding.  Meds and f/u appointments reviewed.  Rx sent electronically to the pharmacy.  Daughter made aware.

## 2021-03-01 DIAGNOSIS — F0390 Unspecified dementia without behavioral disturbance: Principal | ICD-10-CM

## 2021-03-07 DIAGNOSIS — J479 Bronchiectasis, uncomplicated: Principal | ICD-10-CM

## 2021-03-09 ENCOUNTER — Institutional Professional Consult (permissible substitution): Admit: 2021-03-09 | Discharge: 2021-03-10 | Payer: MEDICARE

## 2021-03-09 DIAGNOSIS — J4541 Moderate persistent asthma with (acute) exacerbation: Principal | ICD-10-CM

## 2021-03-12 ENCOUNTER — Ambulatory Visit: Admit: 2021-03-12 | Discharge: 2021-03-13 | Payer: MEDICARE

## 2021-03-12 MED ORDER — CETIRIZINE 10 MG TABLET
ORAL_TABLET | Freq: Every day | ORAL | 2 refills | 30 days | Status: CP
Start: 2021-03-12 — End: 2022-03-12

## 2021-03-12 MED ORDER — LORATADINE 10 MG TABLET
ORAL_TABLET | Freq: Every day | ORAL | 2 refills | 30 days | Status: CP
Start: 2021-03-12 — End: 2021-06-10

## 2021-03-12 MED ORDER — MONTELUKAST 10 MG TABLET
ORAL_TABLET | Freq: Every day | ORAL | 2 refills | 30 days | Status: CP
Start: 2021-03-12 — End: 2021-06-10

## 2021-03-12 MED ORDER — TRELEGY ELLIPTA 100 MCG-62.5 MCG-25 MCG POWDER FOR INHALATION
Freq: Every day | RESPIRATORY_TRACT | 2 refills | 30 days | Status: CP
Start: 2021-03-12 — End: 2021-06-10

## 2021-03-20 MED ORDER — LOSARTAN 100 MG-HYDROCHLOROTHIAZIDE 25 MG TABLET
ORAL_TABLET | Freq: Every day | ORAL | 0 refills | 30 days | Status: CP
Start: 2021-03-20 — End: 2021-04-19

## 2021-03-21 ENCOUNTER — Ambulatory Visit: Admit: 2021-03-21 | Payer: MEDICARE

## 2021-03-23 ENCOUNTER — Ambulatory Visit: Admit: 2021-03-23 | Discharge: 2021-03-24 | Payer: MEDICARE

## 2021-04-04 ENCOUNTER — Ambulatory Visit: Admit: 2021-04-04 | Discharge: 2021-04-05 | Payer: MEDICARE

## 2021-04-24 ENCOUNTER — Other Ambulatory Visit: Payer: Self-pay

## 2021-04-24 ENCOUNTER — Emergency Department: Payer: Medicare Other

## 2021-04-24 ENCOUNTER — Inpatient Hospital Stay
Admission: EM | Admit: 2021-04-24 | Discharge: 2021-04-28 | DRG: 191 | Disposition: A | Discharge status: Home / Self Care | Payer: Medicare Other | Attending: Internal Medicine | Admitting: Internal Medicine

## 2021-04-24 DIAGNOSIS — F0390 Unspecified dementia without behavioral disturbance: Secondary | ICD-10-CM | POA: Diagnosis present

## 2021-04-24 DIAGNOSIS — E785 Hyperlipidemia, unspecified: Secondary | ICD-10-CM | POA: Diagnosis present

## 2021-04-24 DIAGNOSIS — J449 Chronic obstructive pulmonary disease, unspecified: Secondary | ICD-10-CM | POA: Diagnosis not present

## 2021-04-24 DIAGNOSIS — Z7951 Long term (current) use of inhaled steroids: Secondary | ICD-10-CM

## 2021-04-24 DIAGNOSIS — I10 Essential (primary) hypertension: Secondary | ICD-10-CM | POA: Diagnosis present

## 2021-04-24 DIAGNOSIS — Z20822 Contact with and (suspected) exposure to covid-19: Secondary | ICD-10-CM | POA: Diagnosis present

## 2021-04-24 DIAGNOSIS — D696 Thrombocytopenia, unspecified: Secondary | ICD-10-CM | POA: Diagnosis present

## 2021-04-24 DIAGNOSIS — Z79899 Other long term (current) drug therapy: Secondary | ICD-10-CM

## 2021-04-24 DIAGNOSIS — R000 Tachycardia, unspecified: Secondary | ICD-10-CM

## 2021-04-24 DIAGNOSIS — J441 Chronic obstructive pulmonary disease with (acute) exacerbation: Secondary | ICD-10-CM | POA: Diagnosis not present

## 2021-04-24 DIAGNOSIS — J4521 Mild intermittent asthma with (acute) exacerbation: Secondary | ICD-10-CM | POA: Diagnosis present

## 2021-04-24 LAB — RESP PANEL BY RT-PCR (FLU A&B, COVID) ARPGX2
Influenza A by PCR: NEGATIVE
Influenza B by PCR: NEGATIVE
SARS Coronavirus 2 by RT PCR: NEGATIVE

## 2021-04-24 LAB — BASIC METABOLIC PANEL
Anion gap: 11 (ref 5–15)
BUN: 11 mg/dL (ref 8–23)
CO2: 26 mmol/L (ref 22–32)
Calcium: 9.2 mg/dL (ref 8.9–10.3)
Chloride: 104 mmol/L (ref 98–111)
Creatinine, Ser: 0.99 mg/dL (ref 0.44–1.00)
GFR, Estimated: >60 mL/min (ref >60)
Glucose, Bld: 124 mg/dL — ABNORMAL HIGH (ref 70–99)
Potassium: 3.5 mmol/L (ref 3.5–5.1)
Sodium: 141 mmol/L (ref 135–145)

## 2021-04-24 LAB — CBC
HCT: 41.7 % (ref 36.0–46.0)
Hemoglobin: 13.6 g/dL (ref 12.0–15.0)
MCH: 28.2 pg (ref 26.0–34.0)
MCHC: 32.6 g/dL (ref 30.0–36.0)
MCV: 86.5 fL (ref 80.0–100.0)
Platelets: 574 10*3/uL — ABNORMAL HIGH (ref 150–400)
RBC: 4.82 MIL/uL (ref 3.87–5.11)
RDW: 13.5 % (ref 11.5–15.5)
WBC: 9.9 10*3/uL (ref 4.0–10.5)
nRBC: 0 % (ref 0.0–0.2)

## 2021-04-24 LAB — TROPONIN I (HIGH SENSITIVITY)
Troponin I (High Sensitivity): <2 ng/L (ref <18)
Troponin I (High Sensitivity): <2 ng/L (ref <18)

## 2021-04-24 LAB — D-DIMER, QUANTITATIVE: D-Dimer, Quant: 1.56 ug/mL-FEU — ABNORMAL HIGH (ref 0.00–0.50)

## 2021-04-24 MED ORDER — IPRATROPIUM-ALBUTEROL 0.5-2.5 (3) MG/3ML IN SOLN
3.0000 mL | Freq: Once | RESPIRATORY_TRACT | Status: AC
  Administered 2021-04-24: 3 mL via RESPIRATORY_TRACT
  Filled 2021-04-24: qty 3

## 2021-04-24 MED ORDER — IOHEXOL 350 MG/ML SOLN
75.0000 mL | Freq: Once | INTRAVENOUS | Status: AC | PRN
  Administered 2021-04-24: 75 mL via INTRAVENOUS

## 2021-04-24 MED ORDER — METHYLPREDNISOLONE SODIUM SUCC 125 MG IJ SOLR
125.0000 mg | Freq: Once | INTRAMUSCULAR | Status: AC
  Administered 2021-04-24: 125 mg via INTRAVENOUS
  Filled 2021-04-24: qty 2

## 2021-04-24 NOTE — ED Notes (Signed)
Pt family member updated at this time

## 2021-04-24 NOTE — ED Provider Notes (Signed)
[facility] Provider Note    Event Date/Time   First MD Initiated Contact with Patient 04/24/21 1615     (approximate)   History   Shortness of Breath and Cough   HPI  Alyssa Santiago is a 71 y.o. female with a history of dementia and hypertension glycemia hypertension hyperlipidemia and COPD and asthma complains of cough which is nonproductive and shortness of breath going on for a month.  She denies any fever or chills.  The cough has never been productive at least not for the month.  She is not really short of breath.  She has no chest pain or belly pain.      Physical Exam   Triage Vital Signs: ED Triage Vitals  Enc Vitals Group     BP 04/24/21 1442 (!) 162/97     Pulse Rate 04/24/21 1442 (!) 122     Resp 04/24/21 1442 (!) 23     Temp 04/24/21 1442 98 F (36.7 C)     Temp src --      SpO2 04/24/21 1442 92 %     Weight --      Height --      Head Circumference --      Peak Flow --      Pain Score 04/24/21 1445 5     Pain Loc --      Pain Edu? --      Excl. in [place]? --     Most recent vital signs: Vitals:   04/24/21 1930 04/24/21 1945  BP:    Pulse: (!) 113 (!) 125  Resp: (!) 22 (!) 29  Temp:    SpO2: 100% 97%     General: Awake, no distress.  CV:  Good peripheral perfusion.  Heart regular rate and rhythm no audible murmur she is tachycardic. Resp:  Normal effort.  Lungs with scattered wheezes. Abd:  No distention.  Soft nontender bowel sounds positive Extremities: No edema   ED Results / Procedures / Treatments   Labs (all labs ordered are listed, but only abnormal results are displayed) Labs Reviewed  BASIC METABOLIC PANEL - Abnormal; Notable for the following components:      Result Value   Glucose, Bld 124 (*)    All other components within normal limits  CBC - Abnormal; Notable for the following components:   Platelets 574 (*)    All other components within normal limits  D-DIMER, QUANTITATIVE - Abnormal; Notable  for the following components:   D-Dimer, Quant 1.56 (*)    All other components within normal limits  RESP PANEL BY RT-PCR (FLU A&B, COVID) ARPGX2  TROPONIN I (HIGH SENSITIVITY)  TROPONIN I (HIGH SENSITIVITY)     EKG  EKG read interpreted by me shows sinus tachycardia rate of 123 normal axis there are PVCs present.   RADIOLOGY Chest x-ray read by radiology reviewed by me that is the pictures were reviewed by me did not show any acute pathology CT pictures were also reviewed by me and read by radiology and did not show anything to explain her D-dimer or symptomatic shortness of breath   PROCEDURES:  Critical Care performed: Critical care time 20 minutes.  This includes reviewing the patient's studies including a D-dimer ordering and reviewing the chest x-ray and CT and discussing her case with the hospitalist.  Previous to that I had also reviewed some of her old records and EKGs and and chest x-ray.  Procedures   MEDICATIONS ORDERED IN  ED: Medications  ipratropium-albuterol (DUONEB) 0.5-2.5 (3) MG/3ML nebulizer solution 3 mL (3 mLs Nebulization Given 04/24/21 1641)  ipratropium-albuterol (DUONEB) 0.5-2.5 (3) MG/3ML nebulizer solution 3 mL (3 mLs Nebulization Given 04/24/21 1744)  methylPREDNISolone sodium succinate (SOLU-MEDROL) 125 mg/2 mL injection 125 mg (125 mg Intravenous Given 04/24/21 1754)  iohexol (OMNIPAQUE) 350 MG/ML injection 75 mL (75 mLs Intravenous Contrast Given 04/24/21 1757)  ipratropium-albuterol (DUONEB) 0.5-2.5 (3) MG/3ML nebulizer solution 3 mL (3 mLs Nebulization Given 04/24/21 1927)  ipratropium-albuterol (DUONEB) 0.5-2.5 (3) MG/3ML nebulizer solution 3 mL (3 mLs Nebulization Given 04/24/21 2053)     IMPRESSION / MDM / ASSESSMENT AND PLAN / ED COURSE  I reviewed the triage vital signs and the nursing notes. Patient has not been hypoxic however even after 3 DuoNebs and IV Solu-Medrol patient got up to walk to the bathroom to provide a urine specimen patient got  back and was extremely short of breath and tachypneic with wheezes and retractions.  I ordered another DuoNeb for her but she will have to come in for COPD exacerbation asthma exacerbation.  D-dimer was positive but CT chest and of course chest x-ray did not show anything going on.    The patient is on the cardiac monitor to evaluate for evidence of arrhythmia and/or significant heart rate changes.  None was seen    FINAL CLINICAL IMPRESSION(S) / ED DIAGNOSES   Final diagnoses:  COPD exacerbation ([place])  Exacerbation of intermittent asthma, unspecified asthma severity     Rx / DC Orders   ED Discharge Orders     None        Note:  This document was prepared using Dragon voice recognition software and may include unintentional dictation errors.   Nena Polio, MD 04/24/21 [phone]

## 2021-04-24 NOTE — Assessment & Plan Note (Signed)
Controlled.  Continue amlodipine °

## 2021-04-24 NOTE — Assessment & Plan Note (Signed)
Likely related to breathing treatments and increased work of breathing CTA chest negative for PE

## 2021-04-24 NOTE — ED Notes (Signed)
Pt to ED via [facility] from home for shortness of breath. Pt has hx/o dementia and chronic wheezing per ems. Pt was given 1 duoneb treatment, 1 albuterol treatment, and Solumedrol 125 mg. Vital signs per EMS: BP 160/107, HR 118, RR 22, SpO2 98% on room air. Pt has 20 G IV in the Left hand. Pt is in NAD.

## 2021-04-24 NOTE — Assessment & Plan Note (Addendum)
DuoNebs every 6 and as needed IV steroids to transitioned to oral prednisone. Add Z-pack for ?bronchitis and anti-inflammatory

## 2021-04-24 NOTE — H&P (Signed)
History and Physical    Patient: Alyssa Santiago EVO:[id number] DOB: 10-19-1950 DOA: 04/24/2021 DOS: the patient was seen and examined on 04/24/2021 PCP: Vladimir Crofts, MD  Patient coming from: Home  Chief Complaint:  Chief Complaint  Patient presents with   Shortness of Breath   Cough    HPI: Alyssa Santiago is a 71 y.o. female with medical history significant of Hypertension, COPD and dementia who presents to the ED with a 1 month history of shortness of breath and cough not resolving completely with her home inhalers.  She denies fever or chills and denies chest pain.  She denies abdominal pain or diarrhea  ED course: On arrival afebrile, pulse 122, respirations 23 with BP 167/97 Blood work: CBC with thrombocytopenia ptosis of 5 74,000 but otherwise WNL, troponin less than 2x2, D-dimer 1.56.  BMP WNL  EKG, personally viewed and interpreted.  CTA with no acute findings   Review of Systems: As mentioned in the history of present illness. All other systems reviewed and are negative. Past Medical History:  Diagnosis Date   Dementia ([place])    Hypertension    History reviewed. No pertinent surgical history. Social History:  reports that she has never smoked. She does not have any smokeless tobacco history on file. She reports that she does not currently use drugs. She reports that she does not drink alcohol.  No Known Allergies  No family history on file.  Prior to Admission medications   Medication Sig Start Date End Date Taking? Authorizing Provider  albuterol (PROVENTIL) (2.5 MG/3ML) 0.083% nebulizer solution Take 2.5 mg by nebulization every 4 (four) hours as needed. 01/10/21   [provider]  albuterol (VENTOLIN HFA) 108 (90 Base) MCG/ACT inhaler Inhale 2 puffs into the lungs every 6 (six) hours as needed. 09/13/20   [provider]  amLODipine (NORVASC) 10 MG tablet Take 10 mg by mouth daily. Patient not taking: Reported on 02/22/2021    [provider]  atorvastatin (LIPITOR) 40 MG tablet Take 40 mg by mouth daily. 12/07/20   [provider]  budesonide (PULMICORT) 0.5 MG/2ML nebulizer solution Inhale into the lungs. 01/29/21 01/29/22  [provider]  dextromethorphan-guaiFENesin (MUCINEX DM) 30-600 MG 12hr tablet Take 1 tablet by mouth 2 (two) times daily. 02/25/21   Hosie Poisson, MD  montelukast (SINGULAIR) 10 MG tablet Take 10 mg by mouth daily. 12/07/20   [provider]  omeprazole (PRILOSEC) 20 MG capsule Take 20 mg by mouth daily. 02/12/21   [provider]    Physical Exam: Vitals:   04/24/21 1900 04/24/21 1915 04/24/21 1930 04/24/21 1945  BP:  (!) 142/86    Pulse: (!) 113 (!) 114 (!) 113 (!) 125  Resp: (!) 25 (!) 29 (!) 22 (!) 29  Temp:      SpO2: 96% 96% 100% 97%   Physical Exam Vitals and nursing note reviewed.  Constitutional:      General: She is not in acute distress.    Appearance: Normal appearance.  HENT:     Head: Normocephalic and atraumatic.  Cardiovascular:     Rate and Rhythm: Regular rhythm. Tachycardia present.     Pulses: Normal pulses.     Heart sounds: Normal heart sounds. No murmur heard. Pulmonary:     Effort: Pulmonary effort is normal. Tachypnea present.     Breath sounds: Wheezing and rhonchi present.  Abdominal:     General: Bowel sounds are normal.     Palpations: Abdomen is soft.  Tenderness: There is no abdominal tenderness.  Musculoskeletal:        General: No swelling or tenderness. Normal range of motion.     Cervical back: Normal range of motion and neck supple.  Skin:    General: Skin is warm and dry.  Neurological:     General: No focal deficit present.     Mental Status: She is alert. Mental status is at baseline.  Psychiatric:        Mood and Affect: Mood normal.        Behavior: Behavior normal.     Data Reviewed: Data Reviewed: Relevant notes from primary care and specialist visits, past discharge summaries as  available in EHR, including Care Everywhere. Prior diagnostic testing as pertinent to current admission diagnoses Updated medications and problem lists for reconciliation ED course, including vitals, labs, imaging, treatment and response to treatment Triage notes, nursing and pharmacy notes and ED provider's notes Notable results as noted in HPI   Assessment and Plan: * COPD with acute exacerbation ([place])- (present on admission) DuoNebs every 6 and as needed IV steroids to transition to oral prednisone  Sinus tachycardia Likely related to breathing treatments and increased work of breathing CTA chest negative for PE  Essential hypertension- (present on admission) Controlled.  Continue amlodipine       Advance Care Planning:   Code Status: Prior   Consults: none  Family Communication: none  Severity of Illness: The appropriate patient status for this patient is INPATIENT. Inpatient status is judged to be reasonable and necessary in order to provide the required intensity of service to ensure the patient's safety. The patient's presenting symptoms, physical exam findings, and initial radiographic and laboratory data in the context of their chronic comorbidities is felt to place them at high risk for further clinical deterioration. Furthermore, it is not anticipated that the patient will be medically stable for discharge from the hospital within 2 midnights of admission.   * I certify that at the point of admission it is my clinical judgment that the patient will require inpatient hospital care spanning beyond 2 midnights from the point of admission due to high intensity of service, high risk for further deterioration and high frequency of surveillance required.*  Author: Athena Masse, MD 04/24/2021 8:55 PM  For on call review [email or web address].

## 2021-04-24 NOTE — ED Triage Notes (Signed)
Pt comes with c/o SOB and productive cough for over a month. Pt off and on CP.  Pt states some fevers at home. Pt has labored breathing and accessory muscle use noted. Pt is tachycardic.  Pt has noticeable rales present

## 2021-04-25 ENCOUNTER — Encounter: Payer: Self-pay | Admitting: Internal Medicine

## 2021-04-25 DIAGNOSIS — J441 Chronic obstructive pulmonary disease with (acute) exacerbation: Secondary | ICD-10-CM | POA: Diagnosis not present

## 2021-04-25 LAB — STREP PNEUMONIAE URINARY ANTIGEN: Strep Pneumo Urinary Antigen: NEGATIVE

## 2021-04-25 LAB — BLOOD GAS, VENOUS
Acid-Base Excess: 3.4 mmol/L — ABNORMAL HIGH (ref 0.0–2.0)
Bicarbonate: 28.5 mmol/L — ABNORMAL HIGH (ref 20.0–28.0)
O2 Saturation: 70.3 %
Patient temperature: 37
pCO2, Ven: 44 mmHg (ref 44–60)
pH, Ven: 7.42 (ref 7.25–7.43)
pO2, Ven: 43 mmHg (ref 32–45)

## 2021-04-25 MED ORDER — HYDROCOD POLI-CHLORPHE POLI ER 10-8 MG/5ML PO SUER
5.0000 mL | Freq: Two times a day (BID) | ORAL | Status: DC | PRN
  Administered 2021-04-26: 5 mL via ORAL
  Filled 2021-04-25: qty 5

## 2021-04-25 MED ORDER — MONTELUKAST SODIUM 10 MG PO TABS
10.0000 mg | ORAL_TABLET | Freq: Every day | ORAL | Status: DC
  Administered 2021-04-25 – 2021-04-27 (×3): 10 mg via ORAL
  Filled 2021-04-25 (×3): qty 1

## 2021-04-25 MED ORDER — UMECLIDINIUM BROMIDE 62.5 MCG/ACT IN AEPB
1.0000 | INHALATION_SPRAY | Freq: Every day | RESPIRATORY_TRACT | Status: DC
  Administered 2021-04-25 – 2021-04-27 (×3): 1 via RESPIRATORY_TRACT
  Filled 2021-04-25: qty 7

## 2021-04-25 MED ORDER — SODIUM CHLORIDE 0.9 % IV BOLUS
1000.0000 mL | Freq: Once | INTRAVENOUS | Status: AC
  Administered 2021-04-25: 1000 mL via INTRAVENOUS

## 2021-04-25 MED ORDER — PREDNISONE 20 MG PO TABS
40.0000 mg | ORAL_TABLET | Freq: Every day | ORAL | Status: DC
  Administered 2021-04-26 – 2021-04-27 (×2): 40 mg via ORAL
  Filled 2021-04-25 (×2): qty 2

## 2021-04-25 MED ORDER — PANTOPRAZOLE SODIUM 40 MG PO TBEC
40.0000 mg | DELAYED_RELEASE_TABLET | Freq: Every day | ORAL | Status: DC
  Administered 2021-04-25 – 2021-04-27 (×3): 40 mg via ORAL
  Filled 2021-04-25 (×3): qty 1

## 2021-04-25 MED ORDER — ENOXAPARIN SODIUM 40 MG/0.4ML IJ SOSY
40.0000 mg | PREFILLED_SYRINGE | INTRAMUSCULAR | Status: DC
  Administered 2021-04-25 – 2021-04-27 (×3): 40 mg via SUBCUTANEOUS
  Filled 2021-04-25 (×3): qty 0.4

## 2021-04-25 MED ORDER — LEVALBUTEROL HCL 1.25 MG/0.5ML IN NEBU
1.2500 mg | INHALATION_SOLUTION | Freq: Four times a day (QID) | RESPIRATORY_TRACT | Status: DC
  Administered 2021-04-25 – 2021-04-26 (×6): 1.25 mg via RESPIRATORY_TRACT
  Filled 2021-04-25 (×9): qty 0.5

## 2021-04-25 MED ORDER — HYDROXYZINE HCL 10 MG PO TABS
10.0000 mg | ORAL_TABLET | Freq: Three times a day (TID) | ORAL | Status: DC | PRN
  Administered 2021-04-25: 10 mg via ORAL
  Filled 2021-04-25 (×3): qty 1

## 2021-04-25 MED ORDER — LORATADINE 10 MG PO TABS
10.0000 mg | ORAL_TABLET | Freq: Every day | ORAL | Status: DC
  Administered 2021-04-25 – 2021-04-27 (×3): 10 mg via ORAL
  Filled 2021-04-25 (×3): qty 1

## 2021-04-25 MED ORDER — ONDANSETRON HCL 4 MG PO TABS: 4.0000 mg | ORAL_TABLET | Freq: Four times a day (QID) | ORAL | Status: DC | PRN

## 2021-04-25 MED ORDER — ATORVASTATIN CALCIUM 20 MG PO TABS
40.0000 mg | ORAL_TABLET | Freq: Every evening | ORAL | Status: DC
  Administered 2021-04-25 – 2021-04-27 (×3): 40 mg via ORAL
  Filled 2021-04-25 (×3): qty 2

## 2021-04-25 MED ORDER — MAGNESIUM SULFATE 2 GM/50ML IV SOLN
2.0000 g | Freq: Once | INTRAVENOUS | Status: AC
  Administered 2021-04-25: 2 g via INTRAVENOUS
  Filled 2021-04-25: qty 50

## 2021-04-25 MED ORDER — GUAIFENESIN ER 600 MG PO TB12
600.0000 mg | ORAL_TABLET | Freq: Two times a day (BID) | ORAL | Status: DC
  Administered 2021-04-25 – 2021-04-27 (×6): 600 mg via ORAL
  Filled 2021-04-25 (×6): qty 1

## 2021-04-25 MED ORDER — ALBUTEROL SULFATE (2.5 MG/3ML) 0.083% IN NEBU
2.5000 mg | INHALATION_SOLUTION | RESPIRATORY_TRACT | Status: DC | PRN
  Administered 2021-04-25: 2.5 mg via RESPIRATORY_TRACT
  Filled 2021-04-25: qty 3

## 2021-04-25 MED ORDER — HYDROCOD POLI-CHLORPHE POLI ER 10-8 MG/5ML PO SUER: 5.0000 mL | Freq: Two times a day (BID) | ORAL | Status: DC | PRN

## 2021-04-25 MED ORDER — SODIUM CHLORIDE 0.9 % IV SOLN
INTRAVENOUS | Status: AC
Start: 2021-04-25 — End: 2021-04-26

## 2021-04-25 MED ORDER — METHYLPREDNISOLONE SODIUM SUCC 40 MG IJ SOLR
40.0000 mg | Freq: Two times a day (BID) | INTRAMUSCULAR | Status: AC
  Administered 2021-04-25 (×2): 40 mg via INTRAVENOUS
  Filled 2021-04-25 (×2): qty 1

## 2021-04-25 MED ORDER — ACETAMINOPHEN 325 MG PO TABS: 650.0000 mg | ORAL_TABLET | Freq: Four times a day (QID) | ORAL | Status: DC | PRN

## 2021-04-25 MED ORDER — FLUTICASONE FUROATE-VILANTEROL 100-25 MCG/ACT IN AEPB
1.0000 | INHALATION_SPRAY | Freq: Every day | RESPIRATORY_TRACT | Status: DC
  Administered 2021-04-25 – 2021-04-27 (×3): 1 via RESPIRATORY_TRACT
  Filled 2021-04-25: qty 28

## 2021-04-25 MED ORDER — BENZONATATE 100 MG PO CAPS
100.0000 mg | ORAL_CAPSULE | Freq: Three times a day (TID) | ORAL | Status: DC | PRN
  Administered 2021-04-25: 100 mg via ORAL
  Filled 2021-04-25: qty 1

## 2021-04-25 MED ORDER — ACETAMINOPHEN 650 MG RE SUPP: 650.0000 mg | Freq: Four times a day (QID) | RECTAL | Status: DC | PRN

## 2021-04-25 MED ORDER — ONDANSETRON HCL 4 MG/2ML IJ SOLN: 4.0000 mg | Freq: Four times a day (QID) | INTRAMUSCULAR | Status: DC | PRN

## 2021-04-25 MED ORDER — IPRATROPIUM BROMIDE 0.02 % IN SOLN
0.5000 mg | Freq: Four times a day (QID) | RESPIRATORY_TRACT | Status: DC
  Administered 2021-04-25: 0.5 mg via RESPIRATORY_TRACT
  Filled 2021-04-25 (×2): qty 2.5

## 2021-04-25 MED ORDER — IPRATROPIUM-ALBUTEROL 0.5-2.5 (3) MG/3ML IN SOLN
3.0000 mL | Freq: Four times a day (QID) | RESPIRATORY_TRACT | Status: DC
  Administered 2021-04-25 (×2): 3 mL via RESPIRATORY_TRACT
  Filled 2021-04-25 (×2): qty 3

## 2021-04-25 NOTE — Progress Notes (Signed)
Since arrival to floor, patient has been very restless, continuing to get OOB without assistance and needing frequent reminders from staff to call for help when needed. Patient is pleasant but only oriented to self. Bed alarm in place, call bell in reach and patient has been educated and reminded to use call bell.

## 2021-04-25 NOTE — Progress Notes (Signed)
°   04/25/21 1500  Assess: MEWS Score  Temp 98.5 F (36.9 C)  BP (!) 155/99  Pulse Rate (!) 115  Resp 20  Level of Consciousness Alert  SpO2 100 %  O2 Device Room Air  Assess: MEWS Score  MEWS Temp 0  MEWS Systolic 0  MEWS Pulse 2  MEWS RR 0  MEWS LOC 0  MEWS Score 2  MEWS Score Color Yellow  Assess: if the MEWS score is Yellow or Red  Were vital signs taken at a resting state? Yes  Focused Assessment Change from prior assessment (see assessment flowsheet)  Does the patient meet 2 or more of the SIRS criteria? No  MEWS guidelines implemented *See Row Information* Yes  Treat  Pain Scale 0-10  Pain Score 0  Take Vital Signs  Increase Vital Sign Frequency  Yellow: Q 2hr X 2 then Q 4hr X 2, if remains yellow, continue Q 4hrs  Escalate  MEWS: Escalate Yellow: discuss with charge nurse/RN and consider discussing with provider and RRT  Notify: Charge Nurse/RN  Name of Charge Nurse/RN Notified [place] RN  Date Charge Nurse/RN Notified 04/25/21  Time Charge Nurse/RN Notified 1520  Notify: Provider  Provider Name/Title Dr. Erlinda Hong  Date Provider Notified 04/25/21  Time Provider Notified 1533  Notification Type Page  Assess: SIRS CRITERIA  SIRS Temperature  0  SIRS Pulse 1  SIRS Respirations  0  SIRS WBC 0  SIRS Score Sum  1

## 2021-04-25 NOTE — Progress Notes (Signed)
Patients HR in 120's sinus tach consistently. MD notified. Breathing txs changed to xopenex.

## 2021-04-25 NOTE — Progress Notes (Addendum)
PROGRESS NOTE    Alyssa Santiago  ZOX:[id number] DOB: 03-15-1950 DOA: 04/24/2021 PCP: Vladimir Crofts, MD     Brief Narrative:  No notes on file Hypertension, COPD and dementia who presents to the ED with a 1 month history of shortness of breath and cough not resolving completely with her home inhalers. CTA no acute findings  Subjective:  Not oriented to time, knows she is in the hospital, not able to name it Continue to have frequent dry cough,, denies chest pain, feeling weak Reports poor appetite and weight loss  Assessment & Plan:  Principal Problem:   COPD with acute exacerbation ([place]) Active Problems:   Essential hypertension   COPD (chronic obstructive pulmonary disease) ([facility])   Sinus tachycardia    Assessment and Plan:  * COPD with acute exacerbation ([place])- (present on admission) DuoNebs every 6 and as needed IV steroids to transition to oral prednisone  Sinus tachycardia Likely related to breathing treatments and increased work of breathing CTA chest negative for PE  Essential hypertension- (present on admission) Controlled.  Continue amlodipine   Persistent sinus tachycardia, does not appear anxious, does not appear to have symptom, she Appears dehydrated, start hydration, change nebs from duoneb to xopenex/atrovent, check tsh, admit to tele bed Continue to have frequent dry cough, check respiratory viral panel, add antitussives, check urine strep, consider abx for possible bronchitis if persistent cough CTA no PE, no acute infiltrates, no pleural effusion.  .  I have Reviewed nursing notes, Vitals, pain scores, I/o's, Lab results and  imaging results since pt's last encounter, details please see discussion above  I ordered the following labs:  Unresulted Labs (From admission, onward)     Start     Ordered   05/02/21 0500  Creatinine, serum  (enoxaparin (LOVENOX)    CrCl >/= 30 ml/min)  Weekly,   STAT     Comments: while on enoxaparin therapy     04/25/21 0324   04/26/21 0500  TSH  Tomorrow morning,   STAT        04/25/21 1017   04/26/21 8119  Basic metabolic panel  Tomorrow morning,   STAT        04/25/21 1017   04/26/21 0500  Magnesium  Tomorrow morning,   STAT        04/25/21 1017   04/26/21 0500  CBC with Differential/Platelet  Tomorrow morning,   STAT        04/25/21 1017   04/25/21 1007  Strep pneumoniae urinary antigen  Once,   STAT        04/25/21 1006   04/25/21 1005  Respiratory (~20 pathogens) panel by PCR  (Respiratory panel by PCR (~20 pathogens, ~24 hr TAT)  w precautions)  Once,   STAT        04/25/21 1004             DVT prophylaxis: enoxaparin (LOVENOX) injection 40 mg Start: 04/25/21 0800   Code Status:   Code Status: Full Code  Family Communication: call daughter, not able to left message Disposition:   Status is: Inpatient  Dispo: The patient is from: home with daughter               Anticipated d/c is to: home              Anticipated d/c date is: TBD  Antimicrobials:   Anti-infectives (From admission, onward)    None           Objective:  Vitals:   04/25/21 0832 04/25/21 0854 04/25/21 0908 04/25/21 1000  BP: (!) 157/88   (!) 148/91  Pulse: (!) 124 (!) 126 (!) 119 (!) 128  Resp: (!) 30 (!) 28 (!) 22 (!) 22  Temp:      SpO2: 98% 98% 98% 97%  Weight:      Height:       No intake or output data in the 24 hours ending 04/25/21 1020 Filed Weights   04/25/21 0500  Weight: 84.4 kg    Examination:  General exam: alert, awake, pleasantly confused, appear younger than stated age, intermittent dry cough Respiratory system: Clear to auscultation. Respiratory effort normal. Cardiovascular system:  sinus tachycardia Gastrointestinal system: Abdomen is nondistended, soft and nontender.  Normal bowel sounds heard. Central nervous system: Alert and oriented to person. No focal neurological deficits. Extremities:  no edema Skin: No rashes, lesions or ulcers Psychiatry: pleasantly  confused, no agitation .     Data Reviewed: I have personally reviewed  labs and visualized  imaging studies since the last encounter and formulate the plan        Scheduled Meds:  atorvastatin  40 mg Oral QPM   enoxaparin (LOVENOX) injection  40 mg Subcutaneous Q24H   guaiFENesin  600 mg Oral BID   ipratropium  0.5 mg Nebulization Q6H   levalbuterol  1.25 mg Nebulization Q6H   loratadine  10 mg Oral Daily   methylPREDNISolone (SOLU-MEDROL) injection  40 mg Intravenous Q12H   Followed by   Derrill Memo ON 04/26/2021] predniSONE  40 mg Oral Q breakfast   montelukast  10 mg Oral QHS   pantoprazole  40 mg Oral Daily   Continuous Infusions:  sodium chloride     sodium chloride       LOS: 1 day     Florencia Reasons, MD PhD FACP [facility]  Available via Epic secure chat 7am-7pm for nonurgent issues Please page for urgent issues To page the attending provider between 7A-7P or the covering provider during after hours 7P-7A, please log into the web site [email or web address] and access using universal [place] password for that web site. If you do not have the password, please call the hospital operator.    04/25/2021, 10:20 AM

## 2021-04-26 ENCOUNTER — Encounter: Payer: Self-pay | Admitting: Internal Medicine

## 2021-04-26 DIAGNOSIS — J441 Chronic obstructive pulmonary disease with (acute) exacerbation: Secondary | ICD-10-CM | POA: Diagnosis not present

## 2021-04-26 LAB — RESPIRATORY PANEL BY PCR

## 2021-04-26 LAB — BASIC METABOLIC PANEL
Anion gap: 9 (ref 5–15)
BUN: 19 mg/dL (ref 8–23)
CO2: 25 mmol/L (ref 22–32)
Calcium: 8.6 mg/dL — ABNORMAL LOW (ref 8.9–10.3)
Chloride: 106 mmol/L (ref 98–111)
Creatinine, Ser: 0.85 mg/dL (ref 0.44–1.00)
GFR, Estimated: >60 mL/min (ref >60)
Glucose, Bld: 103 mg/dL — ABNORMAL HIGH (ref 70–99)
Potassium: 4 mmol/L (ref 3.5–5.1)
Sodium: 140 mmol/L (ref 135–145)

## 2021-04-26 LAB — CBC WITH DIFFERENTIAL/PLATELET
Abs Immature Granulocytes: 0.21 10*3/uL — ABNORMAL HIGH (ref 0.00–0.07)
Basophils Absolute: 0.1 10*3/uL (ref 0.0–0.1)
Basophils Relative: 0 %
Eosinophils Absolute: 0 10*3/uL (ref 0.0–0.5)
Eosinophils Relative: 0 %
HCT: 36.7 % (ref 36.0–46.0)
Hemoglobin: 12.1 g/dL (ref 12.0–15.0)
Immature Granulocytes: 1 %
Lymphocytes Relative: 8 %
Lymphs Abs: 2 10*3/uL (ref 0.7–4.0)
MCH: 28.5 pg (ref 26.0–34.0)
MCHC: 33 g/dL (ref 30.0–36.0)
MCV: 86.4 fL (ref 80.0–100.0)
Monocytes Absolute: 1.1 10*3/uL — ABNORMAL HIGH (ref 0.1–1.0)
Monocytes Relative: 4 %
Neutro Abs: 22.3 10*3/uL — ABNORMAL HIGH (ref 1.7–7.7)
Neutrophils Relative %: 87 %
Platelets: 546 10*3/uL — ABNORMAL HIGH (ref 150–400)
RBC: 4.25 MIL/uL (ref 3.87–5.11)
RDW: 14.1 % (ref 11.5–15.5)
Smear Review: NORMAL
WBC: 25.7 10*3/uL — ABNORMAL HIGH (ref 4.0–10.5)
nRBC: 0 % (ref 0.0–0.2)

## 2021-04-26 LAB — MAGNESIUM: Magnesium: 1.9 mg/dL (ref 1.7–2.4)

## 2021-04-26 LAB — TSH: TSH: 0.289 u[IU]/mL — ABNORMAL LOW (ref 0.350–4.500)

## 2021-04-26 LAB — T4, FREE: Free T4: 0.96 ng/dL (ref 0.61–1.12)

## 2021-04-26 MED ORDER — LEVALBUTEROL HCL 1.25 MG/0.5ML IN NEBU
1.2500 mg | INHALATION_SOLUTION | Freq: Three times a day (TID) | RESPIRATORY_TRACT | Status: DC
  Administered 2021-04-27 (×2): 1.25 mg via RESPIRATORY_TRACT
  Filled 2021-04-26 (×5): qty 0.5

## 2021-04-26 MED ORDER — AZITHROMYCIN 250 MG PO TABS
250.0000 mg | ORAL_TABLET | Freq: Every day | ORAL | Status: DC
Start: 2021-04-27 — End: 2021-04-28
  Administered 2021-04-27: 250 mg via ORAL
  Filled 2021-04-26: qty 1

## 2021-04-26 MED ORDER — AZITHROMYCIN 250 MG PO TABS
500.0000 mg | ORAL_TABLET | Freq: Every day | ORAL | Status: AC
Start: 2021-04-26 — End: 2021-04-26
  Administered 2021-04-26: 500 mg via ORAL
  Filled 2021-04-26: qty 2

## 2021-04-26 NOTE — Hospital Course (Addendum)
71 yr old female with PMH of hypertension, COPD and dementia who presents to the ED with a 1 month history of shortness of breath and cough not resolving completely with her home inhalers. CTA no acute findings.  Admitted for acute exacerbation of COPD.  2/23: Improving with steroids but still significant wheezing and dyspnea.  2/24: DOE much better today, pt tolerates ambulating with less dyspnea today, not wheezing and improve air movement on exam. Stable O2 sats on room air.  Medically stable for discharge.

## 2021-04-26 NOTE — TOC Initial Note (Signed)
Transition of Care [facility]) - Initial/Assessment Note    Patient Details  Name: Alyssa Santiago MRN: [id number] Date of Birth: Jan 15, 1951  Transition of Care [facility]) CM/SW Contact:    Beverly Sessions, RN Phone Number: 04/26/2021, 1:53 PM  Clinical Narrative:                     Transition of Care (TOC) Screening Note   Patient Details  Name: Alyssa Santiago Date of Birth: 31-Mar-1950   Transition of Care [facility]) CM/SW Contact:    Beverly Sessions, RN Phone Number: 04/26/2021, 1:53 PM    Transition of Care [facility]) has reviewed patient and no TOC needs have been identified at this time. We will continue to monitor patient advancement through interdisciplinary progression rounds. If new patient transition needs arise, please place a TOC consult.   Met with patient at bedside.  Hx of dementia.  Knows her name and that she is in the hospital. States she lives at home with her daughter.   Daughter provides transport to appointments.  PCP Manuella Ghazi. Pharmacy [facility]. Patient states that she ambulates with a walker at home.  Patient currently on RA.  Attempted to call daughter to verify information, VM not set up     Patient Goals and CMS Choice        Expected Discharge Plan and Services                                                Prior Living Arrangements/Services                       Activities of Daily Living Home Assistive Devices/Equipment: None ADL Screening (condition at time of admission) Patient's cognitive ability adequate to safely complete daily activities?: Yes Is the patient deaf or have difficulty hearing?: No Does the patient have difficulty seeing, even when wearing glasses/contacts?: No Does the patient have difficulty concentrating, remembering, or making decisions?: Yes Patient able to express need for assistance with ADLs?: Yes Does the patient have difficulty dressing or bathing?: No Independently performs ADLs?: Yes  (appropriate for developmental age) Does the patient have difficulty walking or climbing stairs?: No Weakness of Legs: None Weakness of Arms/Hands: None  Permission Sought/Granted                  Emotional Assessment              Admission diagnosis:  COPD (chronic obstructive pulmonary disease) ([place]) [J44.9] COPD exacerbation ([place]) [J44.1] COPD with acute exacerbation ([place]) [J44.1] Exacerbation of intermittent asthma, unspecified asthma severity [J45.21] Patient Active Problem List   Diagnosis Date Noted   COPD with acute exacerbation ([place]) 04/24/2021   Sinus tachycardia 04/24/2021   COPD (chronic obstructive pulmonary disease) ([place]) 02/23/2021   Asthma exacerbation 02/22/2021   Thrombocytosis 02/22/2021   Leukocytosis 02/22/2021   Hyperglycemia 02/22/2021   Essential hypertension 02/22/2021   Mixed hyperlipidemia 02/22/2021   GERD (gastroesophageal reflux disease) 02/22/2021   Dementia ([place]) 02/22/2021   Type 2 diabetes mellitus without complication, without long-term current use of insulin ([place]) 05/26/2019   PCP:  Vladimir Crofts, MD Pharmacy:   [facility] [address]), [place] [address] [place] [employer]) [place] [address] Phone: [phone] Fax: [phone]     Social Determinants of  Health (SDOH) Interventions    Readmission Risk Interventions No flowsheet data found.

## 2021-04-26 NOTE — Progress Notes (Signed)
°  Progress Note   Patient: Alyssa Santiago [id number]:[id number] DOB: 1950/08/22 DOA: 04/24/2021     2 DOS: the patient was seen and examined on 04/26/2021   Brief hospital course: 71 yr old female with PMH of hypertension, COPD and dementia who presents to the ED with a 1 month history of shortness of breath and cough not resolving completely with her home inhalers. CTA no acute findings.  Admitted for acute exacerbation of COPD.  Improving with steroids but still significant wheezing and dyspnea.  Assessment and Plan: * COPD with acute exacerbation ([place])- (present on admission) DuoNebs every 6 and as needed IV steroids to transitioned to oral prednisone. Add Z-pack for ?bronchitis and anti-inflammatory  Sinus tachycardia Likely related to breathing treatments and increased work of breathing CTA chest negative for PE  Essential hypertension- (present on admission) Controlled.  Continue amlodipine        Subjective: Pt reports still very short of breath this morning just up standing at the sink.  She otherwise reports feeling overall well.  Coughing a lot, denies fever chills.  Physical Exam: Vitals:   04/26/21 0454 04/26/21 0742 04/26/21 1134 04/26/21 1551  BP: (!) 165/100 (!) 139/91 (!) 153/93 (!) 161/98  Pulse: 98 97 (!) 104 93  Resp: 20 20 19 19   Temp: 97.9 F (36.6 C) 98.1 F (36.7 C) 98 F (36.7 C) 98.5 F (36.9 C)  TempSrc:  Oral Oral Oral  SpO2: 98% 100% 100% 100%  Weight:      Height:       General exam: awake, alert, no acute distress HEENT: atraumatic, clear conjunctiva, anicteric sclera, moist mucus membranes, hearing grossly normal  Respiratory system: diffuse expiratory wheezes and poor air movement, increased respiratory effort with conversational dyspnea. Cardiovascular system: normal S1/S2, RRR, no JVD, murmurs, rubs, gallops, no pedal edema.   Gastrointestinal system: soft, NT, ND, no HSM felt, +bowel sounds. Central nervous system: oriented to self and  hospital, no gross focal neurologic deficits, normal speech Extremities: moves all, no edema, normal tone Skin: dry, intact, normal temperature Psychiatry: normal mood, congruent affect  Data Reviewed:  Labs notable for  glucose 103, Ca 8.6, WBC 25.7k platelets 546k, TSH low 0.289 but free T4 normal 0.96  Family Communication: none at bedside, will attempt to call  Disposition: Status is: Inpatient Remains inpatient appropriate because: Severity of illness with ongoing significant dyspnea and wheezing          Planned Discharge Destination: Home     Time spent: 35 minutes  Author: Ezekiel Slocumb, DO 04/26/2021 6:25 PM  For on call review [email or web address].

## 2021-04-27 DIAGNOSIS — J441 Chronic obstructive pulmonary disease with (acute) exacerbation: Secondary | ICD-10-CM | POA: Diagnosis not present

## 2021-04-27 LAB — CBC
HCT: 36.8 % (ref 36.0–46.0)
Hemoglobin: 11.9 g/dL — ABNORMAL LOW (ref 12.0–15.0)
MCH: 28.2 pg (ref 26.0–34.0)
MCHC: 32.3 g/dL (ref 30.0–36.0)
MCV: 87.2 fL (ref 80.0–100.0)
Platelets: 506 10*3/uL — ABNORMAL HIGH (ref 150–400)
RBC: 4.22 MIL/uL (ref 3.87–5.11)
RDW: 14.2 % (ref 11.5–15.5)
WBC: 14.6 10*3/uL — ABNORMAL HIGH (ref 4.0–10.5)
nRBC: 0 % (ref 0.0–0.2)

## 2021-04-27 LAB — BASIC METABOLIC PANEL
Anion gap: 7 (ref 5–15)
BUN: 17 mg/dL (ref 8–23)
CO2: 27 mmol/L (ref 22–32)
Calcium: 8.5 mg/dL — ABNORMAL LOW (ref 8.9–10.3)
Chloride: 107 mmol/L (ref 98–111)
Creatinine, Ser: 0.72 mg/dL (ref 0.44–1.00)
GFR, Estimated: >60 mL/min (ref >60)
Glucose, Bld: 95 mg/dL (ref 70–99)
Potassium: 3.6 mmol/L (ref 3.5–5.1)
Sodium: 141 mmol/L (ref 135–145)

## 2021-04-27 LAB — MAGNESIUM: Magnesium: 1.8 mg/dL (ref 1.7–2.4)

## 2021-04-27 MED ORDER — PREDNISONE 10 MG PO TABS: ORAL_TABLET | ORAL | 0 refills | Status: AC

## 2021-04-27 MED ORDER — BENZONATATE 100 MG PO CAPS: 100.0000 mg | ORAL_CAPSULE | Freq: Three times a day (TID) | ORAL | 0 refills | Status: AC | PRN

## 2021-04-27 MED ORDER — PREDNISONE 10 MG PO TABS: ORAL_TABLET | ORAL | 0 refills | Status: DC

## 2021-04-27 MED ORDER — AZITHROMYCIN 250 MG PO TABS: ORAL_TABLET | ORAL | 0 refills | Status: AC

## 2021-04-27 NOTE — Care Management Important Message (Signed)
Important Message  Patient Details  Name: Alyssa Santiago MRN: [id number] Date of Birth: 07-07-1950   Medicare Important Message Given:  Yes     Juliann Pulse A Simone Rodenbeck 04/27/2021, 12:11 PM

## 2021-04-27 NOTE — TOC Transition Note (Signed)
Transition of Care [facility]) - CM/SW Discharge Note   Patient Details  Name: Alyssa Santiago MRN: [id number] Date of Birth: 1950-04-28  Transition of Care [facility]) CM/SW Contact:  Beverly Sessions, RN Phone Number: 04/27/2021, 11:55 AM   Clinical Narrative:     Patient to discharge today  Daughter Alyssa Santiago states that her sister Alyssa Santiago will pick up patient around 7pm after work  MD to call and update family         Patient Goals and CMS Choice        Discharge Placement                       Discharge Plan and Services                                     Social Determinants of Health (SDOH) Interventions     Readmission Risk Interventions No flowsheet data found.

## 2021-04-27 NOTE — Discharge Summary (Addendum)
Physician Discharge Summary   Patient: Alyssa Santiago MRN: [id number] DOB: [birth year]  Admit date:     04/24/2021  Discharge date: 04/27/2021  Discharge Physician: Ezekiel Slocumb   PCP: Vladimir Crofts, MD   Recommendations at discharge:   Follow up on resolution of COPD exacerbation Follow up with Primary Care in 1-2 weeks Check CBC/CMP in 1-2 weeks   Discharge Diagnoses: Principal Problem:   COPD with acute exacerbation ([place]) Active Problems:   Essential hypertension   COPD (chronic obstructive pulmonary disease) ([facility])   Sinus tachycardia Acute exacerbation of COPD / Asthma    Hospital Course: 71 yr old female with PMH of hypertension, COPD and dementia who presents to the ED with a 1 month history of shortness of breath and cough not resolving completely with her home inhalers. CTA no acute findings.  Admitted for acute exacerbation of COPD.  2/23: Improving with steroids but still significant wheezing and dyspnea.  2/24: DOE much better today, pt tolerates ambulating with less dyspnea today, not wheezing and improve air movement on exam. Stable O2 sats on room air.  Medically stable for discharge.     Assessment and Plan: * COPD with acute exacerbation ([place])- (present on admission) DuoNebs every 6 and as needed IV steroids to transitioned to oral prednisone. Add Z-pack for ?bronchitis and anti-inflammatory  Sinus tachycardia Likely related to breathing treatments and increased work of breathing CTA chest negative for PE  Essential hypertension- (present on admission) Controlled.  Continue amlodipine    Discharge with 3 more days Zithromax to complete 5 days, and prednisone taper over 3 more days.  Weaned to room air and O2 sats stable. Dyspnea on exertion is greatly improved.  Air movement and wheezing on exam also improved.  Pt is clinically improved and stable for discharge home with PCP follow up.  Discussed with patient's daughter by phone and she  is agreeable with the plan and discharge, PCP follow up.        Consultants: None Procedures performed: None  Disposition: Home Diet recommendation:  Discharge Diet Orders (From admission, onward)     Start     Ordered   04/27/21 0000  Diet - low sodium heart healthy        04/27/21 1122           Cardiac diet  DISCHARGE MEDICATION: Allergies as of 04/28/2021   No Known Allergies      Medication List     STOP taking these medications    Symbicort 160-4.5 MCG/ACT inhaler Generic drug: budesonide-formoterol       TAKE these medications    albuterol 108 (90 Base) MCG/ACT inhaler Commonly known as: VENTOLIN HFA Inhale 2 puffs into the lungs every 6 (six) hours as needed.   albuterol (2.5 MG/3ML) 0.083% nebulizer solution Commonly known as: PROVENTIL Take 2.5 mg by nebulization every 4 (four) hours as needed.   atorvastatin 40 MG tablet Commonly known as: LIPITOR Take 40 mg by mouth daily.   azithromycin 250 MG tablet Commonly known as: Zithromax Z-Pak Take one tablet (250 mg) by mouth daily for three days   benzonatate 100 MG capsule Commonly known as: TESSALON Take 1 capsule (100 mg total) by mouth 3 (three) times daily as needed for cough.   budesonide 0.5 MG/2ML nebulizer solution Commonly known as: PULMICORT Inhale into the lungs.   cetirizine 10 MG tablet Commonly known as: ZYRTEC Take 10 mg by mouth daily.   dextromethorphan-guaiFENesin 30-600 MG 12hr tablet Commonly known  as: MUCINEX DM Take 1 tablet by mouth 2 (two) times daily.   ipratropium-albuterol 0.5-2.5 (3) MG/3ML Soln Commonly known as: DUONEB Take by nebulization.   loratadine 10 MG tablet Commonly known as: CLARITIN Take 10 mg by mouth daily.   losartan-hydrochlorothiazide 100-25 MG tablet Commonly known as: HYZAAR Take 1 tablet by mouth daily.   montelukast 10 MG tablet Commonly known as: SINGULAIR Take 10 mg by mouth daily.   omeprazole 20 MG capsule Commonly  known as: PRILOSEC Take 20 mg by mouth daily.   Trelegy Ellipta 100-62.5-25 MCG/ACT Aepb Generic drug: Fluticasone-Umeclidin-Vilant Take 1 puff by mouth daily.       ASK your doctor about these medications    predniSONE 10 MG tablet Commonly known as: DELTASONE Take 3 tablets (30 mg total) by mouth daily with breakfast for 1 day, THEN 2 tablets (20 mg total) daily with breakfast for 1 day, THEN 1 tablet (10 mg total) daily with breakfast for 1 day. Start taking on: April 27, 2021 Ask about: Should I take this medication?         Discharge Exam: Filed Weights   04/25/21 0500  Weight: 84.4 kg   General exam: awake, alert, no acute distress HEENT: moist mucus membranes, hearing grossly normal  Respiratory system: improved air movement, no wheezes, rales or rhonchi, normal respiratory effort at rest, on room air. Cardiovascular system: normal S1/S2, RRR, no pedal edema.   Gastrointestinal system: soft, NT, ND, no HSM felt, +bowel sounds. Central nervous system: A&O to self and hospital. no gross focal neurologic deficits, normal speech Extremities: moves all, no edema, normal tone Skin: dry, intact, normal temperature Psychiatry: normal mood, congruent affect    Condition at discharge: stable  The results of significant diagnostics from this hospitalization (including imaging, microbiology, ancillary and laboratory) are listed below for reference.   Imaging Studies: DG Chest 2 View  Result Date: 04/24/2021 CLINICAL DATA:  Provided history: Shortness of breath. Shortness of breath and productive cough for over 1 month. Patient reports fevers at home. EXAM: CHEST - 2 VIEW COMPARISON:  Prior chest radiographs 02/22/2021. FINDINGS: Heart size within normal limits. Aortic atherosclerosis. No appreciable airspace consolidation. No evidence of pleural effusion or pneumothorax. No acute bony abnormality identified. Prominent thoracic dextrocurvature. IMPRESSION: No evidence of  acute cardiopulmonary abnormality. Aortic Atherosclerosis (ICD10-I70.0). Prominent thoracic dextrocurvature. Electronically Signed   By: Kellie Simmering D.O.   On: 04/24/2021 15:35   CT Angio Chest PE W and/or Wo Contrast  Result Date: 04/24/2021 CLINICAL DATA:  Pulmonary embolism (PE) suspected, positive D-dimer. has hx/o dementia and chronic wheezing per ems. Pt was given 1 duoneb treatment, 1 albuterol treatment, and Solumedrol 125 mg. Vital signs per EMS: BP 160/107, HR 118 EXAM: CT ANGIOGRAPHY CHEST WITH CONTRAST TECHNIQUE: Multidetector CT imaging of the chest was performed using the standard protocol during bolus administration of intravenous contrast. Multiplanar CT image reconstructions and MIPs were obtained to evaluate the vascular anatomy. RADIATION DOSE REDUCTION: This exam was performed according to the departmental dose-optimization program which includes automated exposure control, adjustment of the mA and/or kV according to patient size and/or use of iterative reconstruction technique. CONTRAST:  53mL OMNIPAQUE IOHEXOL 350 MG/ML SOLN COMPARISON:  Chest x-ray 03/24/2021 FINDINGS: Cardiovascular: Satisfactory opacification of the pulmonary arteries to the segmental level. No evidence of pulmonary embolism. The main pulmonary artery is normal in caliber. Normal heart size. No significant pericardial effusion. The thoracic aorta is normal in caliber. Mild to moderate atherosclerotic plaque of the thoracic  aorta. At least 2 vessel coronary artery calcifications. Mediastinum/Nodes: No enlarged mediastinal, hilar, or axillary lymph nodes. Thyroid gland, trachea, and esophagus demonstrate no significant findings. Lungs/Pleura: No focal consolidation. No pulmonary nodule. No pulmonary mass. No pleural effusion. No pneumothorax. Upper Abdomen: No acute abnormality. Musculoskeletal: No abdominal wall hernia or abnormality. No suspicious lytic or blastic osseous lesions. No acute displaced fracture.  Dextrocurvature the lower thoracic spine. Review of the MIP images confirms the above findings. IMPRESSION: 1. No pulmonary embolus. 2. No acute intrathoracic abnormality. 3. Aortic Atherosclerosis (ICD10-I70.0) with at least 2 vessel coronary calcifications. Electronically Signed   By: Iven Finn M.D.   On: 04/24/2021 18:18    Microbiology: Results for orders placed or performed during the hospital encounter of 04/24/21  Resp Panel by RT-PCR (Flu A&B, Covid) Nasopharyngeal Swab     Status: None   Collection Time: 04/24/21  2:50 PM   Specimen: Nasopharyngeal Swab; Nasopharyngeal(NP) swabs in vial transport medium  Result Value Ref Range Status   SARS Coronavirus 2 by RT PCR NEGATIVE NEGATIVE Final    Comment: (NOTE) SARS-CoV-2 target nucleic acids are NOT DETECTED.  The SARS-CoV-2 RNA is generally detectable in upper respiratory specimens during the acute phase of infection. The lowest concentration of SARS-CoV-2 viral copies this assay can detect is 138 copies/mL. A negative result does not preclude SARS-Cov-2 infection and should not be used as the sole basis for treatment or other patient management decisions. A negative result may occur with  improper specimen collection/handling, submission of specimen other than nasopharyngeal swab, presence of viral mutation(s) within the areas targeted by this assay, and inadequate number of viral copies(<138 copies/mL). A negative result must be combined with clinical observations, patient history, and epidemiological information. The expected result is Negative.  Fact Sheet for Patients:  EntrepreneurPulse.com.au  Fact Sheet for Healthcare Providers:  IncredibleEmployment.be  This test is no t yet approved or cleared by the [place] FDA and  has been authorized for detection and/or diagnosis of SARS-CoV-2 by FDA under an Emergency Use Authorization (EUA). This EUA will remain  in effect (meaning  this test can be used) for the duration of the COVID-19 declaration under Section 564(b)(1) of the Act, 21 U.S.C.section 360bbb-3(b)(1), unless the authorization is terminated  or revoked sooner.       Influenza A by PCR NEGATIVE NEGATIVE Final   Influenza B by PCR NEGATIVE NEGATIVE Final    Comment: (NOTE) The Xpert Xpress SARS-CoV-2/FLU/RSV plus assay is intended as an aid in the diagnosis of influenza from Nasopharyngeal swab specimens and should not be used as a sole basis for treatment. Nasal washings and aspirates are unacceptable for Xpert Xpress SARS-CoV-2/FLU/RSV testing.  Fact Sheet for Patients: EntrepreneurPulse.com.au  Fact Sheet for Healthcare Providers: IncredibleEmployment.be  This test is not yet approved or cleared by the [place] FDA and has been authorized for detection and/or diagnosis of SARS-CoV-2 by FDA under an Emergency Use Authorization (EUA). This EUA will remain in effect (meaning this test can be used) for the duration of the COVID-19 declaration under Section 564(b)(1) of the Act, 21 U.S.C. section 360bbb-3(b)(1), unless the authorization is terminated or revoked.  Performed at [facility], [place], [place] [address]   Respiratory (~20 pathogens) panel by PCR     Status: None   Collection Time: 04/25/21 10:29 AM   Specimen: Nasopharyngeal Swab; Respiratory  Result Value Ref Range Status   Adenovirus NOT DETECTED NOT DETECTED Final   Coronavirus 229E NOT  DETECTED NOT DETECTED Final    Comment: (NOTE) The Coronavirus on the Respiratory Panel, DOES NOT test for the novel  Coronavirus (2019 nCoV)    Coronavirus HKU1 NOT DETECTED NOT DETECTED Final   Coronavirus NL63 NOT DETECTED NOT DETECTED Final   Coronavirus OC43 NOT DETECTED NOT DETECTED Final   Metapneumovirus NOT DETECTED NOT DETECTED Final   Rhinovirus / Enterovirus NOT DETECTED NOT DETECTED Final   Influenza A NOT  DETECTED NOT DETECTED Final   Influenza B NOT DETECTED NOT DETECTED Final   Parainfluenza Virus 1 NOT DETECTED NOT DETECTED Final   Parainfluenza Virus 2 NOT DETECTED NOT DETECTED Final   Parainfluenza Virus 3 NOT DETECTED NOT DETECTED Final   Parainfluenza Virus 4 NOT DETECTED NOT DETECTED Final   Respiratory Syncytial Virus NOT DETECTED NOT DETECTED Final   Bordetella pertussis NOT DETECTED NOT DETECTED Final   Bordetella Parapertussis NOT DETECTED NOT DETECTED Final   Chlamydophila pneumoniae NOT DETECTED NOT DETECTED Final   Mycoplasma pneumoniae NOT DETECTED NOT DETECTED Final    Comment: Performed at [facility], [place] [address] [place], [place] [address]    Labs: CBC: Recent Labs  Lab 04/27/21 0413  WBC 14.6*  HGB 11.9*  HCT 36.8  MCV 87.2  PLT 444*   Basic Metabolic Panel: Recent Labs  Lab 04/27/21 0413  NA 141  K 3.6  CL 107  CO2 27  GLUCOSE 95  BUN 17  CREATININE 0.72  CALCIUM 8.5*  MG 1.8   Liver Function Tests: No results for input(s): AST, ALT, ALKPHOS, BILITOT, PROT, ALBUMIN in the last 168 hours. CBG: No results for input(s): GLUCAP in the last 168 hours.  Discharge time spent: greater than 30 minutes.  Signed: Ezekiel Slocumb, DO [facility] 05/03/2021

## 2021-04-27 NOTE — Progress Notes (Signed)
Staff was able to reach Alyssa Santiago, daughter, that pt had not been picked up, she states that other daughter [name] was going to be coming. Staff called [name], she states she was leaving [place] and on the way, notified her that she would have to enter the ED that the front door was locked, agreed.

## 2021-04-27 NOTE — Progress Notes (Signed)
Mobility Specialist - Progress Note   04/27/21 1527  Mobility  Activity Ambulated with assistance in hallway;Ambulated with assistance to bathroom  Level of Assistance Standby assist, set-up cues, supervision of patient - no hands on  Assistive Device Other (Comment) (HHA)  Distance Ambulated (ft) 200 ft  Activity Response Tolerated well  $Mobility charge 1 Mobility    Pre-mobility: 104 HR, 97% SpO2 During mobility: 117 HR, 94% SpO2   Pt sitting in recliner upon arrival, utilizing RA. Pt ambulated in hallway with HHA, no LOB. Does voice generalized weakness; mostly in LE. Slow, cautious gait. Mild SOB with activity, but O2 maintains mid 90s. Educated pt on PLB, activity pacing, and encouraged use of RW once discharged until strength regained. Pt left in bathroom with NT entering upon exit.    Kathee Delton Mobility Specialist 04/27/21, 3:35 PM

## 2021-04-27 NOTE — Discharge Summary (Incomplete Revision)
Physician Discharge Summary   Patient: Alyssa Santiago MRN: [id number] DOB: 01-Feb-1951  Admit date:     04/24/2021  Discharge date: 04/27/21  Discharge Physician: Ezekiel Slocumb   PCP: Vladimir Crofts, MD   Recommendations at discharge:    Follow up on resolution of COPD exacerbation Follow up with Primary Care in 1-2 weeks Check CBC/CMP in 1-2 weeks   Discharge Diagnoses: Principal Problem:   COPD with acute exacerbation ([place]) Active Problems:   Essential hypertension   COPD (chronic obstructive pulmonary disease) (HCC)   Sinus tachycardia    Hospital Course: 71 yr old female with PMH of hypertension, COPD and dementia who presents to the ED with a 1 month history of shortness of breath and cough not resolving completely with her home inhalers. CTA no acute findings.  Admitted for acute exacerbation of COPD.  Improving with steroids but still significant wheezing and dyspnea.  ***  Assessment and Plan: * COPD with acute exacerbation ([place])- (present on admission) DuoNebs every 6 and as needed IV steroids to transitioned to oral prednisone. Add Z-pack for ?bronchitis and anti-inflammatory  Sinus tachycardia Likely related to breathing treatments and increased work of breathing CTA chest negative for PE  Essential hypertension- (present on admission) Controlled.  Continue amlodipine    Discharge with 3 more days Zithromax to complete 5 days, and prednisone taper over 3 more days.  Weaned to room air and O2 sats stable. Dyspnea on exertion is greatly improved.  Air movement and wheezing on exam also improved.  Pt is clinically improved and stable for discharge home with PCP follow up.  I attempted to reach daughter by phone to update, but number on file appears is incorrect.  I was unable to reach family, have asked RN and TOC to assisst.        Consultants: None Procedures performed: None  Disposition: Home Diet recommendation:  Discharge Diet Orders  (From admission, onward)     Start     Ordered   04/27/21 0000  Diet - low sodium heart healthy        04/27/21 1122           Cardiac diet  DISCHARGE MEDICATION: Allergies as of 04/27/2021   No Known Allergies      Medication List     STOP taking these medications    Symbicort 160-4.5 MCG/ACT inhaler Generic drug: budesonide-formoterol       TAKE these medications    albuterol 108 (90 Base) MCG/ACT inhaler Commonly known as: VENTOLIN HFA Inhale 2 puffs into the lungs every 6 (six) hours as needed.   albuterol (2.5 MG/3ML) 0.083% nebulizer solution Commonly known as: PROVENTIL Take 2.5 mg by nebulization every 4 (four) hours as needed.   atorvastatin 40 MG tablet Commonly known as: LIPITOR Take 40 mg by mouth daily.   azithromycin 250 MG tablet Commonly known as: Zithromax Z-Pak Take one tablet (250 mg) by mouth daily for three days   benzonatate 100 MG capsule Commonly known as: TESSALON Take 1 capsule (100 mg total) by mouth 3 (three) times daily as needed for cough.   budesonide 0.5 MG/2ML nebulizer solution Commonly known as: PULMICORT Inhale into the lungs.   cetirizine 10 MG tablet Commonly known as: ZYRTEC Take 10 mg by mouth daily.   dextromethorphan-guaiFENesin 30-600 MG 12hr tablet Commonly known as: MUCINEX DM Take 1 tablet by mouth 2 (two) times daily.   ipratropium-albuterol 0.5-2.5 (3) MG/3ML Soln Commonly known as: DUONEB Take by nebulization.  loratadine 10 MG tablet Commonly known as: CLARITIN Take 10 mg by mouth daily.   losartan-hydrochlorothiazide 100-25 MG tablet Commonly known as: HYZAAR Take 1 tablet by mouth daily.   montelukast 10 MG tablet Commonly known as: SINGULAIR Take 10 mg by mouth daily.   omeprazole 20 MG capsule Commonly known as: PRILOSEC Take 20 mg by mouth daily.   predniSONE 10 MG tablet Commonly known as: DELTASONE Take 3 tablets (30 mg total) by mouth daily with breakfast for 1 day, THEN 2  tablets (20 mg total) daily with breakfast for 1 day, THEN 1 tablet (10 mg total) daily with breakfast for 1 day. Start taking on: April 27, 2021   Trelegy Ellipta 100-62.5-25 MCG/ACT Aepb Generic drug: Fluticasone-Umeclidin-Vilant Take 1 puff by mouth daily.         Discharge Exam: Filed Weights   04/25/21 0500  Weight: 84.4 kg   General exam: awake, alert, no acute distress HEENT: moist mucus membranes, hearing grossly normal  Respiratory system: improved air movement, no wheezes, rales or rhonchi, normal respiratory effort at rest, on room air. Cardiovascular system: normal S1/S2, RRR, no pedal edema.   Gastrointestinal system: soft, NT, ND, no HSM felt, +bowel sounds. Central nervous system: A&O to self and hospital. no gross focal neurologic deficits, normal speech Extremities: moves all, no edema, normal tone Skin: dry, intact, normal temperature Psychiatry: normal mood, congruent affect    Condition at discharge: stable  The results of significant diagnostics from this hospitalization (including imaging, microbiology, ancillary and laboratory) are listed below for reference.   Imaging Studies: DG Chest 2 View  Result Date: 04/24/2021 CLINICAL DATA:  Provided history: Shortness of breath. Shortness of breath and productive cough for over 1 month. Patient reports fevers at home. EXAM: CHEST - 2 VIEW COMPARISON:  Prior chest radiographs 02/22/2021. FINDINGS: Heart size within normal limits. Aortic atherosclerosis. No appreciable airspace consolidation. No evidence of pleural effusion or pneumothorax. No acute bony abnormality identified. Prominent thoracic dextrocurvature. IMPRESSION: No evidence of acute cardiopulmonary abnormality. Aortic Atherosclerosis (ICD10-I70.0). Prominent thoracic dextrocurvature. Electronically Signed   By: Kellie Simmering D.O.   On: 04/24/2021 15:35   CT Angio Chest PE W and/or Wo Contrast  Result Date: 04/24/2021 CLINICAL DATA:  Pulmonary  embolism (PE) suspected, positive D-dimer. has hx/o dementia and chronic wheezing per ems. Pt was given 1 duoneb treatment, 1 albuterol treatment, and Solumedrol 125 mg. Vital signs per EMS: BP 160/107, HR 118 EXAM: CT ANGIOGRAPHY CHEST WITH CONTRAST TECHNIQUE: Multidetector CT imaging of the chest was performed using the standard protocol during bolus administration of intravenous contrast. Multiplanar CT image reconstructions and MIPs were obtained to evaluate the vascular anatomy. RADIATION DOSE REDUCTION: This exam was performed according to the departmental dose-optimization program which includes automated exposure control, adjustment of the mA and/or kV according to patient size and/or use of iterative reconstruction technique. CONTRAST:  76mL OMNIPAQUE IOHEXOL 350 MG/ML SOLN COMPARISON:  Chest x-ray 03/24/2021 FINDINGS: Cardiovascular: Satisfactory opacification of the pulmonary arteries to the segmental level. No evidence of pulmonary embolism. The main pulmonary artery is normal in caliber. Normal heart size. No significant pericardial effusion. The thoracic aorta is normal in caliber. Mild to moderate atherosclerotic plaque of the thoracic aorta. At least 2 vessel coronary artery calcifications. Mediastinum/Nodes: No enlarged mediastinal, hilar, or axillary lymph nodes. Thyroid gland, trachea, and esophagus demonstrate no significant findings. Lungs/Pleura: No focal consolidation. No pulmonary nodule. No pulmonary mass. No pleural effusion. No pneumothorax. Upper Abdomen: No acute abnormality. Musculoskeletal: No  abdominal wall hernia or abnormality. No suspicious lytic or blastic osseous lesions. No acute displaced fracture. Dextrocurvature the lower thoracic spine. Review of the MIP images confirms the above findings. IMPRESSION: 1. No pulmonary embolus. 2. No acute intrathoracic abnormality. 3. Aortic Atherosclerosis (ICD10-I70.0) with at least 2 vessel coronary calcifications. Electronically Signed    By: Iven Finn M.D.   On: 04/24/2021 18:18    Microbiology: Results for orders placed or performed during the hospital encounter of 04/24/21  Resp Panel by RT-PCR (Flu A&B, Covid) Nasopharyngeal Swab     Status: None   Collection Time: 04/24/21  2:50 PM   Specimen: Nasopharyngeal Swab; Nasopharyngeal(NP) swabs in vial transport medium  Result Value Ref Range Status   SARS Coronavirus 2 by RT PCR NEGATIVE NEGATIVE Final    Comment: (NOTE) SARS-CoV-2 target nucleic acids are NOT DETECTED.  The SARS-CoV-2 RNA is generally detectable in upper respiratory specimens during the acute phase of infection. The lowest concentration of SARS-CoV-2 viral copies this assay can detect is 138 copies/mL. A negative result does not preclude SARS-Cov-2 infection and should not be used as the sole basis for treatment or other patient management decisions. A negative result may occur with  improper specimen collection/handling, submission of specimen other than nasopharyngeal swab, presence of viral mutation(s) within the areas targeted by this assay, and inadequate number of viral copies(<138 copies/mL). A negative result must be combined with clinical observations, patient history, and epidemiological information. The expected result is Negative.  Fact Sheet for Patients:  EntrepreneurPulse.com.au  Fact Sheet for Healthcare Providers:  IncredibleEmployment.be  This test is no t yet approved or cleared by the [place] FDA and  has been authorized for detection and/or diagnosis of SARS-CoV-2 by FDA under an Emergency Use Authorization (EUA). This EUA will remain  in effect (meaning this test can be used) for the duration of the COVID-19 declaration under Section 564(b)(1) of the Act, 21 U.S.C.section 360bbb-3(b)(1), unless the authorization is terminated  or revoked sooner.       Influenza A by PCR NEGATIVE NEGATIVE Final   Influenza B by PCR NEGATIVE  NEGATIVE Final    Comment: (NOTE) The Xpert Xpress SARS-CoV-2/FLU/RSV plus assay is intended as an aid in the diagnosis of influenza from Nasopharyngeal swab specimens and should not be used as a sole basis for treatment. Nasal washings and aspirates are unacceptable for Xpert Xpress SARS-CoV-2/FLU/RSV testing.  Fact Sheet for Patients: EntrepreneurPulse.com.au  Fact Sheet for Healthcare Providers: IncredibleEmployment.be  This test is not yet approved or cleared by the [place] FDA and has been authorized for detection and/or diagnosis of SARS-CoV-2 by FDA under an Emergency Use Authorization (EUA). This EUA will remain in effect (meaning this test can be used) for the duration of the COVID-19 declaration under Section 564(b)(1) of the Act, 21 U.S.C. section 360bbb-3(b)(1), unless the authorization is terminated or revoked.  Performed at [facility], [place], [place] [address]   Respiratory (~20 pathogens) panel by PCR     Status: None   Collection Time: 04/25/21 10:29 AM   Specimen: Nasopharyngeal Swab; Respiratory  Result Value Ref Range Status   Adenovirus NOT DETECTED NOT DETECTED Final   Coronavirus 229E NOT DETECTED NOT DETECTED Final    Comment: (NOTE) The Coronavirus on the Respiratory Panel, DOES NOT test for the novel  Coronavirus (2019 nCoV)    Coronavirus HKU1 NOT DETECTED NOT DETECTED Final   Coronavirus NL63 NOT DETECTED NOT DETECTED Final   Coronavirus OC43  NOT DETECTED NOT DETECTED Final   Metapneumovirus NOT DETECTED NOT DETECTED Final   Rhinovirus / Enterovirus NOT DETECTED NOT DETECTED Final   Influenza A NOT DETECTED NOT DETECTED Final   Influenza B NOT DETECTED NOT DETECTED Final   Parainfluenza Virus 1 NOT DETECTED NOT DETECTED Final   Parainfluenza Virus 2 NOT DETECTED NOT DETECTED Final   Parainfluenza Virus 3 NOT DETECTED NOT DETECTED Final   Parainfluenza Virus 4 NOT DETECTED NOT  DETECTED Final   Respiratory Syncytial Virus NOT DETECTED NOT DETECTED Final   Bordetella pertussis NOT DETECTED NOT DETECTED Final   Bordetella Parapertussis NOT DETECTED NOT DETECTED Final   Chlamydophila pneumoniae NOT DETECTED NOT DETECTED Final   Mycoplasma pneumoniae NOT DETECTED NOT DETECTED Final    Comment: Performed at [facility], [place] [address] [place], [place] [address]    Labs: CBC: Recent Labs  Lab 04/24/21 1447 04/26/21 0351 04/27/21 0413  WBC 9.9 25.7* 14.6*  NEUTROABS  --  22.3*  --   HGB 13.6 12.1 11.9*  HCT 41.7 36.7 36.8  MCV 86.5 86.4 87.2  PLT 574* 546* 542*   Basic Metabolic Panel: Recent Labs  Lab 04/24/21 1447 04/26/21 0351 04/27/21 0413  NA 141 140 141  K 3.5 4.0 3.6  CL 104 106 107  CO2 26 25 27   GLUCOSE 124* 103* 95  BUN 11 19 17   CREATININE 0.99 0.85 0.72  CALCIUM 9.2 8.6* 8.5*  MG  --  1.9 1.8   Liver Function Tests: No results for input(s): AST, ALT, ALKPHOS, BILITOT, PROT, ALBUMIN in the last 168 hours. CBG: No results for input(s): GLUCAP in the last 168 hours.  Discharge time spent: greater than 30 minutes.  Signed: Ezekiel Slocumb, DO [facility] 04/27/2021

## 2021-04-28 NOTE — Discharge Instructions (Signed)
reviewed

## 2021-04-28 NOTE — Progress Notes (Signed)
Pt discharged, via wheelchair, assist to car via RN, daughter Jeanett Schlein present, discharge instructions reviewed and AVS given to daughter, understands and agree.

## 2021-04-30 DIAGNOSIS — J449 Chronic obstructive pulmonary disease, unspecified: Principal | ICD-10-CM

## 2021-05-02 ENCOUNTER — Ambulatory Visit: Admit: 2021-05-02 | Discharge: 2021-05-03 | Payer: MEDICARE

## 2021-05-02 DIAGNOSIS — J479 Bronchiectasis, uncomplicated: Principal | ICD-10-CM

## 2021-05-02 DIAGNOSIS — Z141 Cystic fibrosis carrier: Principal | ICD-10-CM

## 2021-05-02 DIAGNOSIS — E876 Hypokalemia: Principal | ICD-10-CM

## 2021-05-02 DIAGNOSIS — J4550 Severe persistent asthma, uncomplicated: Principal | ICD-10-CM

## 2021-05-02 MED ORDER — MUCUS CLEARING DEVICE
Freq: Two times a day (BID) | 0 refills | 0.00000 days | Status: CP
Start: 2021-05-02 — End: 2021-05-02

## 2021-05-02 MED ORDER — SODIUM CHLORIDE 7 % FOR NEBULIZATION
Freq: Once | RESPIRATORY_TRACT | 3 refills | 20.00000 days | Status: CP
Start: 2021-05-02 — End: 2021-05-02

## 2021-05-02 MED ORDER — ALBUTEROL SULFATE 2.5 MG/3 ML (0.083 %) SOLUTION FOR NEBULIZATION
Freq: Four times a day (QID) | RESPIRATORY_TRACT | 3 refills | 5 days | Status: CP | PRN
Start: 2021-05-02 — End: 2022-05-02

## 2021-05-02 MED ORDER — TRELEGY ELLIPTA 200 MCG-62.5 MCG-25 MCG POWDER FOR INHALATION
Freq: Every day | RESPIRATORY_TRACT | 0 refills | 0 days | Status: CP
Start: 2021-05-02 — End: ?

## 2021-05-05 MED ORDER — SODIUM CHLORIDE 7 % FOR NEBULIZATION
Freq: Two times a day (BID) | RESPIRATORY_TRACT | 3 refills | 10 days | Status: CP
Start: 2021-05-05 — End: 2021-06-19

## 2021-05-07 ENCOUNTER — Ambulatory Visit
Admit: 2021-05-07 | Discharge: 2021-05-08 | Payer: MEDICARE | Attending: Student in an Organized Health Care Education/Training Program | Primary: Student in an Organized Health Care Education/Training Program

## 2021-05-07 DIAGNOSIS — F0390 Unspecified dementia without behavioral disturbance: Principal | ICD-10-CM

## 2021-05-07 DIAGNOSIS — R4189 Other symptoms and signs involving cognitive functions and awareness: Principal | ICD-10-CM

## 2021-05-07 DIAGNOSIS — E119 Type 2 diabetes mellitus without complications: Principal | ICD-10-CM

## 2021-05-09 ENCOUNTER — Ambulatory Visit: Admit: 2021-05-09 | Discharge: 2021-05-10 | Payer: MEDICARE

## 2021-05-10 DIAGNOSIS — Z141 Cystic fibrosis carrier: Principal | ICD-10-CM

## 2021-05-16 DIAGNOSIS — J4550 Severe persistent asthma, uncomplicated: Principal | ICD-10-CM

## 2021-05-16 DIAGNOSIS — J479 Bronchiectasis, uncomplicated: Principal | ICD-10-CM

## 2021-05-23 DIAGNOSIS — J4551 Severe persistent asthma with (acute) exacerbation: Principal | ICD-10-CM

## 2021-05-23 DIAGNOSIS — J449 Chronic obstructive pulmonary disease, unspecified: Principal | ICD-10-CM

## 2021-05-23 MED ORDER — DUPILUMAB 300 MG/2 ML SUBCUTANEOUS PEN INJECTOR
SUBCUTANEOUS | 3 refills | 0.00000 days | Status: CP
Start: 2021-05-23 — End: 2021-05-23
  Filled 2021-06-18: qty 4, 14d supply, fill #0

## 2021-05-23 MED ORDER — EPINEPHRINE 0.3 MG/0.3 ML INJECTION, AUTO-INJECTOR
Freq: Once | INTRAMUSCULAR | 0 refills | 2.00000 days | Status: CP
Start: 2021-05-23 — End: 2021-05-23
  Filled 2021-06-05: qty 2, 15d supply, fill #0

## 2021-05-24 DIAGNOSIS — J4551 Severe persistent asthma with (acute) exacerbation: Principal | ICD-10-CM

## 2021-05-25 DIAGNOSIS — I10 Essential (primary) hypertension: Principal | ICD-10-CM

## 2021-05-25 MED ORDER — LOSARTAN 100 MG-HYDROCHLOROTHIAZIDE 25 MG TABLET
ORAL_TABLET | Freq: Every day | ORAL | 0 refills | 30 days | Status: CP
Start: 2021-05-25 — End: 2021-06-24

## 2021-05-30 NOTE — Unmapped (Signed)
[facility] Specialty Medication Onboarding    Specialty Medication: DUPIXENT PEN 300 mg/2 mL Pnij (dupilumab)  Prior Authorization: Approved   Financial Assistance: No - copay  <$25  Final Copay/Day Supply: $0 / 14 (loading)          $0 / 28 (maintenance)    Insurance Restrictions: None     Notes to Pharmacist: n/a    The triage team has completed the benefits investigation and has determined that the patient is able to fill this medication at [facility]. Please contact the patient to complete the onboarding or follow up with the prescribing physician as needed.

## 2021-06-01 NOTE — Unmapped (Signed)
[facility]   Patient Onboarding/Medication Counseling    Per provider, first dose of Dupixent will be received in clinic at next appt on 4/26.    Regina Vargas is a 71 y.o. female with severe persistent steroid-dependent asthma who I am counseling today on initiation of therapy.  I am speaking to the patient's family member, daughter Regina Vargas).    Was a Nurse, learning disability used for this call? No    Verified patient's date of birth / HIPAA.    Specialty medication(s) to be sent: Inflammatory Disorders: Dupixent      Non-specialty medications/supplies to be sent: Epi-pen      Medications not needed at this time: n/a       The patient declined counseling on medication administration because they will receive counseling in clinic. The information in the declined sections below are for informational purposes only and was not discussed with patient.     Dupixent (dupilumab)    Medication & Administration     Dosage: Asthma, moderate to severe: Inject 600mg  under the skin as a loading dose followed by 300mg  every 14 days thereafter    Administration:     Dupixent Pen  1. Gather all supplies needed for injection on a clean, flat working surface: medication syringe removed from packaging, alcohol swab, sharps container, etc.  2. Look at the medication label - look for correct medication, correct dose, and check the expiration date  3. Look at the medication - the liquid in the pen should appear clear and colorless to pale yellow  4. Lay the pen on a flat surface and allow it to warm up to room temperature for at least 45 minutes  5. Select injection site - you can use the front of your thigh or your belly (but not the area 2 inches around your belly button); if someone else is giving you the injection you can also use your upper arm in the skin covering your triceps muscle  6. Prepare injection site - wash your hands and clean the skin at the injection site with an alcohol swab and let it air dry, do not touch the injection site again before the injection  7. Hold the middle of the body of the pen and gently pull the needle safety cap straight out. Be careful not to bend the needle. Do not remove until immediately prior to injection  8. Press the pen down onto the injection site at a 90 degree angle.   9. You will hear a click as the injection starts, and then a second click when the injection is ALMOST done. Keep holding the pen against the skin for 5 more seconds after the second click.   10. Check that the pen is empty by looking in the viewing window - the yellow indicator bar should be stopped, and should fill the window.   11. Remove the pen from the skin by lifting straight up.   12. Dispose of the used pen immediately in your sharps disposal container  13. If you see any blood at the injection site, press a cotton ball or gauze on the site and maintain pressure until the bleeding stops, do not rub the injection site    Adherence/Missed dose instructions:  If a dose is missed, administer within 7 days from the missed dose and then resume the original schedule. If the missed dose is not administered within 7 days, you can either wait until the next dose on the original schedule or take  your dose now and resume every 14 days from the new injection date. Do not use 2 doses at the same time or extra doses.      Goals of Therapy     -Reduce impairment - few night-time awakenings; maintenance of normal daily activities; optimization of lung function  -Minimal need (less than 2 days per week) of inhaled short acting beta agonists to relieve symptoms  -Prevention of recurrent exacerbations and need for emergency department/hospital care  -Prevention of reduced lung growth in children  -Maintenance of effective psychosocial functioning    Side Effects & Monitoring Parameters     ??? Injection site reaction (redness, irritation, inflammation localized to the site of administration)  ??? Signs of a common cold - minor sore throat, runny or stuffy nose, etc.  ??? Recurrence of cold sores (herpes simplex)      The following side effects should be reported to the provider:  ??? Signs of a hypersensitivity reaction - rash; hives; itching; red, swollen, blistered, or peeling skin; wheezing; tightness in the chest or throat; difficulty breathing, swallowing, or talking; swelling of the mouth, face, lips, tongue, or throat; etc.  ??? Eye pain or irritation or any visual disturbances  ??? Shortness of breath or worsening of breathing      Contraindications, Warnings, & Precautions     ??? Have your bloodwork checked as you have been told by your prescriber   ??? Birth control pills and other hormone-based birth control may not work as well to prevent pregnancy  ??? Talk with your doctor if you are pregnant, planning to become pregnant, or breastfeeding  ??? Discuss the possible need for holding your dose(s) of Dupixent?? when a planned procedure is scheduled with the prescriber as it may delay healing/recovery timeline       Drug/Food Interactions     ??? Medication list reviewed in Epic. The patient was instructed to inform the care team before taking any new medications or supplements. No drug interactions identified.   ??? Talk with you prescriber or pharmacist before receiving any live vaccinations while taking this medication and after you stop taking it    Storage, Handling Precautions, & Disposal     ??? Store this medication in the refrigerator. Do not freeze  ??? If needed, you may store at room temperature for up to 14 days  ??? Store in original packaging, protected from light  ??? Do not shake  ??? Dispose of used syringes in a sharps disposal container      Current Medications (including OTC/herbals), Comorbidities and Allergies     Current Outpatient Medications   Medication Sig Dispense Refill   ??? albuterol 2.5 mg /3 mL (0.083 %) nebulizer solution Inhale 3 mL (2.5 mg total) by nebulization every six (6) hours as needed for wheezing. 60 mL 3   ??? atorvastatin (LIPITOR) 40 MG tablet Take 1 tablet (40 mg total) by mouth daily. 90 tablet 3   ??? azithromycin (ZITHROMAX) 250 MG tablet Take one tablet (250 mg) by mouth daily for three days     ??? benzonatate (TESSALON) 100 MG capsule Take 100 mg by mouth.     ??? cetirizine (ZYRTEC) 10 MG tablet Take 1 tablet (10 mg total) by mouth daily. 30 tablet 2   ??? dupilumab 300 mg/2 mL PnIj Inject the contents of 1 pen (300 mg total) under the skin every fourteen (14) days. 4 mL 3   ??? EPINEPHrine (EPIPEN) 0.3 mg/0.3 mL injection Inject 0.3 mL (  0.3 mg total) into the muscle once for 1 dose. 2 each 0   ??? fluticasone-umeclidin-vilanter (TRELEGY ELLIPTA) 200-62.5-25 mcg DsDv Inhale 1 puff  in the morning. 180 each 0   ??? inhalational spacing device (AEROCHAMBER MV) Spcr 1 each by Miscellaneous route in the morning. 1 each 0   ??? inhalational spacing device (AEROCHAMBER MV) Spcr 1 each by Miscellaneous route daily as needed. 1 each 0   ??? loratadine (CLARITIN) 10 mg tablet Take 1 tablet (10 mg total) by mouth daily. 30 tablet 2   ??? losartan-hydrochlorothiazide (HYZAAR) 100-25 mg per tablet Take 1 tablet by mouth daily. 30 tablet 0   ??? montelukast (SINGULAIR) 10 mg tablet Take 1 tablet (10 mg total) by mouth daily. 30 tablet 2   ??? omeprazole (PRILOSEC) 20 MG capsule Take 20 mg by mouth daily.     ??? sodium chloride 7% 7 % Nebu Inhale 3 mL by nebulization Two (2) times a day for 90 doses. Please always use after albuterol nebs. Use twice a day to start. 240 mL 3     Current Facility-Administered Medications   Medication Dose Route Frequency Provider Last Rate Last Admin   ??? albuterol (ACCUNEB) nebulizer solution 0.63 mg  0.63 mg Nebulization Once Rosary Lively, MD           Allergies   Allergen Reactions   ??? Hydralazine-Reserpin-Hcthiazid Rash     Hypokalemia   ??? Shellfish Containing Products Rash       Patient Active Problem List   Diagnosis   ??? Cortical senile cataract   ??? Hypercholesterolemia   ??? Presbyopia   ??? Scoliosis   ??? Senile nuclear sclerosis   ??? Diastolic dysfunction   ??? Health maintenance examination   ??? Hypertension   ??? Moderate persistent asthma with acute exacerbation   ??? Type 2 diabetes mellitus without complication, without long-term current use of insulin (CMS-HCC)   ??? Dementia without behavioral disturbance (CMS-HCC)   ??? OSA (obstructive sleep apnea)   ??? Pituitary adenoma (CMS-HCC)   ??? Seasonal allergic rhinitis   ??? Vitamin D deficiency   ??? Bronchiectasis with acute exacerbation (CMS-HCC)   ??? Acute on chronic respiratory failure with hypoxia (CMS-HCC)   ??? Thrombocytosis, unspecified       Reviewed and up to date in Epic.    Appropriateness of Therapy     Acute infections noted within Epic:  No active infections  Patient reported infection: None    Is medication and dose appropriate based on diagnosis and infection status? Yes    Prescription has been clinically reviewed: Yes      Baseline Quality of Life Assessment      How many days over the past month did your severe persistent steroid-dependent asthma  keep you from your normal activities? For example, brushing your teeth or getting up in the morning. Daughter states mom has moderate asthma & SOB which can limit ability to complete daily activities    Financial Information     Medication Assistance provided: Prior Authorization    Anticipated copay of $0 (loading and maintenance dose) reviewed with patient. Verified delivery address.    Delivery Information     Scheduled delivery date: 06/19/21    Expected start date: 06/27/21 (Appt is already scheduled)    Medication will be delivered via [facility] clinic to the temporary address in [place].  This shipment will not require a signature.      Explained the services  we provide at [facility] and that each month we would call to set up refills.  Stressed importance of returning phone calls so that we could ensure they receive their medications in time each month.  Informed patient that we should be setting up refills 7-10 days prior to when they will run out of medication.  A pharmacist will reach out to perform a clinical assessment periodically.  Informed patient that a welcome packet, containing information about our pharmacy and other support services, a Notice of Privacy Practices, and a drug information handout will be sent.      The patient or caregiver noted above participated in the development of this care plan and knows that they can request review of or adjustments to the care plan at any time.      Patient or caregiver verbalized understanding of the above information as well as how to contact the pharmacy at [phone] option 4 with any questions/concerns.  The pharmacy is open Monday through Friday 8:30am-4:30pm.  A pharmacist is available 24/7 via pager to answer any clinical questions they may have.    Patient Specific Needs     - Does the patient have any physical, cognitive, or cultural barriers? No    - Does the patient have adequate living arrangements? (i.e. the ability to store and take their medication appropriately) Yes    - Did you identify any home environmental safety or security hazards? No    - Patient prefers to have medications discussed with  Family Member     - Is the patient or caregiver able to read and understand education materials at a high school level or above? Yes    - Patient's primary language is  English     - Is the patient high risk? No    SOCIAL DETERMINANTS OF HEALTH     At the [facility], we have learned that life circumstances - like trouble affording food, housing, utilities, or transportation can affect the health of many of our patients.   That is why we wanted to ask: are you currently experiencing any life circumstances that are negatively impacting your health and/or quality of life? Patient declined to answer    Social Determinants of Health     Financial Resource Strain: Low Risk    ??? Difficulty of Paying Living Expenses: Not hard at all Internet Connectivity: Not on file   Food Insecurity: No Food Insecurity   ??? Worried About Programme researcher, broadcasting/film/video in the Last Year: Never true   ??? Ran Out of Food in the Last Year: Never true   Tobacco Use: Medium Risk   ??? Smoking Tobacco Use: Former   ??? Smokeless Tobacco Use: Never   ??? Passive Exposure: Not on file   Housing/Utilities: Low Risk    ??? Within the past 12 months, have you ever stayed: outside, in a car, in a tent, in an overnight shelter, or temporarily in someone else's home (i.e. couch-surfing)?: No   ??? Are you worried about losing your housing?: No   ??? Within the past 12 months, have you been unable to get utilities (heat, electricity) when it was really needed?: No   Alcohol Use: Not on file   Transportation Needs: No Transportation Needs   ??? Lack of Transportation (Medical): No   ??? Lack of Transportation (Non-Medical): No   Substance Use: Not on file   Health Literacy: Not on file   Physical Activity: Not on file  Interpersonal Safety: Not on file   Stress: Not on file   Intimate Partner Violence: Not on file   Depression: Not at risk   ??? PHQ-2 Score: 0   Social Connections: Not on file       Would you be willing to receive help with any of the needs that you have identified today? Not applicable       Oliva Bustard  [facility] Specialty Pharmacist

## 2021-06-13 ENCOUNTER — Ambulatory Visit
Admit: 2021-06-13 | Discharge: 2021-06-14 | Payer: MEDICARE | Attending: Student in an Organized Health Care Education/Training Program | Primary: Student in an Organized Health Care Education/Training Program

## 2021-06-13 ENCOUNTER — Ambulatory Visit: Admit: 2021-06-13 | Discharge: 2021-06-14 | Payer: MEDICARE

## 2021-06-13 MED ORDER — MONTELUKAST 10 MG TABLET
ORAL_TABLET | Freq: Every day | ORAL | 3 refills | 90 days | Status: CP
Start: 2021-06-13 — End: 2022-06-08

## 2021-06-13 MED ORDER — LORATADINE 10 MG TABLET
ORAL_TABLET | Freq: Every day | ORAL | 3 refills | 90 days | Status: CP
Start: 2021-06-13 — End: 2022-06-08

## 2021-06-13 MED ORDER — PREDNISONE 20 MG TABLET
ORAL_TABLET | Freq: Every day | ORAL | 0 refills | 5 days | Status: CP
Start: 2021-06-13 — End: 2021-06-18

## 2021-06-13 MED ADMIN — ipratropium-albuteroL (DUO-NEB) 0.5-2.5 mg/3 mL nebulizer solution 3 mL: 3 mL | RESPIRATORY_TRACT | @ 21:00:00 | Stop: 2021-06-13

## 2021-06-13 MED ADMIN — ipratropium (ATROVENT) 0.02 % nebulizer solution 500 mcg: 500 ug | RESPIRATORY_TRACT | @ 21:00:00 | Stop: 2021-06-13

## 2021-06-13 NOTE — Unmapped (Signed)
[facility] at [facility]    Ms. Peil is a 71 y.o. patient who presents to clinic today regarding the following issues:            Assessment/Plan:  Problem List Items Addressed This Visit        Endocrine    Type 2 diabetes mellitus without complication, without long-term current use of insulin (CMS-HCC)     Last A1c:  Lab Results   Component Value Date    A1C 5.7 06/13/2021    A1C 5.6 12/07/2020    A1C 6.2 07/29/2019     A1c goal is <7.0%  Diabetes is well controlled    Current regimen: none  Medication changes today: none    Recommend continued regular exercise.  Recommend avoiding carbohydrate rich foods and moderation.    Most Recent Foot Exam Date: 12/07/2020  Eye Exam date: 09/03/2019  Prescribed aspirin: No  Prescribed ACE inhibitor: No OR Prescribed ARBs: Yes  Prescribed statins: Yes  Pneumonia vaccination: 02/24/2021  Last PHQ-2 score: Not Found              Respiratory    Moderate persistent asthma with acute exacerbation     Severe Persistent Asthma: uncontrolled. Follows w/ pulm. At time of presentation, believe to have acute exacerbation. Unclear precipitating factor. NWOB on RA, satting 94%. Diffuse wheezing. Can consider URI in s/o cough. Provided w/ duo-neb in clinic w/ minimal improvement. CXR and exam not supportive of PNA. Will prescribe prednisone burst. Has follow-up with PCP on 06/21/21 and w/ 06/27/21. Provided w/ return precautions.   - continue trelegy inhaler  - refill singulair  - start 5 day prednisone   [ ]  f/u RPP          Relevant Medications    predniSONE (DELTASONE) 20 MG tablet    albuterol nebulizer solution 2.5 mg    ipratropium (ATROVENT) 0.02 % nebulizer solution 500 mcg (Completed)    montelukast (SINGULAIR) 10 mg tablet    loratadine (CLARITIN) 10 mg tablet    Other Relevant Orders    XR Chest 2 views (Completed)    Respiratory Pathogen Panel with COVID-19       Nervous and Auditory    Dementia without behavioral disturbance (CMS-HCC) - Primary (Chronic)     MOCA of 10 at last check about a year ago, and pt's daughter w/ concerns that memory continues to steadily decline.  MRI w/ chronic microvascular changes, which are worsening from prior, but discussed with daughter that there is no gold standard diagnosis for dementia.  Biggest concern at this point is for pt's safety at home, as she lives w/ daughter but she works during the day. In the process of establishing with PACE. Referral placed to the [facility] through Neurology.          Relevant Orders    Ambulatory referral to Neurology   Other Visit Diagnoses     Bronchiectasis without complication (CMS-HCC)        Relevant Medications    albuterol nebulizer solution 2.5 mg    ipratropium (ATROVENT) 0.02 % nebulizer solution 500 mcg (Completed)    montelukast (SINGULAIR) 10 mg tablet    loratadine (CLARITIN) 10 mg tablet    Other Relevant Orders    XR Chest 2 views (Completed)    Respiratory Pathogen Panel with COVID-19    Other specified interstitial pulmonary diseases (CMS-HCC)        Relevant Medications    albuterol nebulizer solution  2.5 mg    ipratropium (ATROVENT) 0.02 % nebulizer solution 500 mcg (Completed)    montelukast (SINGULAIR) 10 mg tablet    loratadine (CLARITIN) 10 mg tablet    Other Relevant Orders    Respiratory Pathogen Panel with COVID-19          Attending: Dr. Rogelia Boga        Subjective:  Chief Complaint   Patient presents with   ??? Shortness of Breath   ??? Wheezing   ??? Asthma   ??? Medication Problem     Asthma med      Is in the process of enrolling in the [facility] Program. Currently interviewing a home health aide to help out.  Endorses increased cough and wheeze over the past week. Endorses needing to use her albuterol inhaler more frequently w/o appreciable improvement in sxs. Endorses increase in sputum volume and change in color. Denies fever and sick contacts.     Objective:  BP: 133/83, Heart Rate: 104, Weight: 83.9 kg (185 lb). SpO2 94%.     Physical Exam  Vitals reviewed. Cardiovascular:      Rate and Rhythm: Normal rate and regular rhythm.      Pulses: Normal pulses.      Heart sounds: Normal heart sounds.   Pulmonary:      Effort: Pulmonary effort is normal.      Breath sounds: Wheezing present.           [address]  [place], [place] [address] ??? Telephone [phone] ??? Fax [phone]  [email or web address]

## 2021-06-13 NOTE — Unmapped (Addendum)
[facility]: [phone]     You were seen in clinic today for difficulty breathing. We believe your symptoms are due to an asthma exacerbation. You were given a breathing treatment. We are sending you home with an oral steroid that you will take once a day until it is all gone. It's very important to take continue taking this medication, even if you start to feel better.     If you smoke, STOP. Also, avoid secondhand smoke as much as possible. Call your doctor immediately if you cough up yellow, dark brown, or bloody sputum.     Asthma can get worse after it gets better, sometimes suddenly. If your breathing worsens and does not improve with your inhaler, or you need to use your inhaler more than 2 times every 4 hours, you need to go to the Emergency Department.    You should still continue to take your trelegy inhaler. See below for information about Trelegy.    TRELEGY ELLIPTA contains 3 active ingredients, fluticasone furoate, umeclidinium and vilanterol.  - Fluticasone furoate is an inhaled corticosteroid (ICS). It reduces inflammation in the airways of the lungs, which can ease breathing problems in COPD and asthma, and helps prevent ???flareups??? in COPD. Corticosteroids also help to prevent attacks of asthma.   - Umeclidinium is a long-acting muscarinic antagonist (LAMA) and vilanterol is a long-acting beta2-agonist (LABA). These 2 medicines work together to help open and relax the muscles in the airways and make it easier for air to get in and out of the lungs. There is no cure for COPD or asthma, but TRELEGY ELLIPTA helps to control it. It is therefore important that you continue to take TRELEGY ELLIPTA regularly, even if you feel fine.    If you have immediate concerns, you may contact our clinic at [phone]. If you are calling after hours, you may follow the prompts to connect you to [facility], our 24 hour nurse advice. However, if you are having an emergency, you should call 911 or present to the Emergency Department.

## 2021-06-14 NOTE — Unmapped (Signed)
Last A1c:  Lab Results   Component Value Date    A1C 5.7 06/13/2021    A1C 5.6 12/07/2020    A1C 6.2 07/29/2019     A1c goal is <7.0%  Diabetes is well controlled    Current regimen: none  Medication changes today: none    Recommend continued regular exercise.  Recommend avoiding carbohydrate rich foods and moderation.    Most Recent Foot Exam Date: 12/07/2020  Eye Exam date: 09/03/2019  Prescribed aspirin: No  Prescribed ACE inhibitor: No OR Prescribed ARBs: Yes  Prescribed statins: Yes  Pneumonia vaccination: 02/24/2021  Last PHQ-2 score: Not Found

## 2021-06-14 NOTE — Unmapped (Signed)
MOCA of 10 at last check about a year ago, and pt's daughter w/ concerns that memory continues to steadily decline.  MRI w/ chronic microvascular changes, which are worsening from prior, but discussed with daughter that there is no gold standard diagnosis for dementia.  Biggest concern at this point is for pt's safety at home, as she lives w/ daughter but she works during the day. In the process of establishing with PACE. Referral placed to the [facility] through Neurology.

## 2021-06-14 NOTE — Unmapped (Addendum)
Severe Persistent Asthma: uncontrolled. Follows w/ pulm. At time of presentation, believe to have acute exacerbation. Unclear precipitating factor. NWOB on RA, satting 94%. Diffuse wheezing. Can consider URI in s/o cough. Provided w/ duo-neb in clinic w/ minimal improvement. CXR and exam not supportive of PNA. Will prescribe prednisone burst. Has follow-up with PCP on 06/21/21 and w/ 06/27/21. Provided w/ return precautions.   - continue trelegy inhaler  - refill singulair  - start 5 day prednisone   [ ]  f/u RPP

## 2021-06-18 NOTE — Unmapped (Signed)
I saw and evaluated the patient, participating in the key portions of the service.  I reviewed Dr. Randall An note.  I agree with her findings and plan. Ulla Gallo, MD

## 2021-06-22 DIAGNOSIS — I10 Essential (primary) hypertension: Principal | ICD-10-CM

## 2021-06-22 MED ORDER — LOSARTAN 100 MG-HYDROCHLOROTHIAZIDE 25 MG TABLET
ORAL_TABLET | Freq: Every day | ORAL | 0 refills | 30 days | Status: CP
Start: 2021-06-22 — End: 2021-07-22

## 2021-06-27 ENCOUNTER — Ambulatory Visit: Admit: 2021-06-27 | Discharge: 2021-06-27 | Payer: MEDICARE

## 2021-06-27 DIAGNOSIS — G4733 Obstructive sleep apnea (adult) (pediatric): Principal | ICD-10-CM

## 2021-06-27 DIAGNOSIS — J479 Bronchiectasis, uncomplicated: Principal | ICD-10-CM

## 2021-06-27 DIAGNOSIS — J45991 Cough variant asthma: Principal | ICD-10-CM

## 2021-06-27 DIAGNOSIS — J329 Chronic sinusitis, unspecified: Principal | ICD-10-CM

## 2021-06-27 DIAGNOSIS — J4550 Severe persistent asthma, uncomplicated: Principal | ICD-10-CM

## 2021-06-27 LAB — RHEUMATOID FACTOR, QUANT: RHEUMATOID FACTOR: 27.2 [IU]/mL — ABNORMAL HIGH (ref <14.0)

## 2021-06-27 MED ORDER — BENZONATATE 100 MG CAPSULE
ORAL_CAPSULE | Freq: Two times a day (BID) | ORAL | 0 refills | 10 days | Status: CP | PRN
Start: 2021-06-27 — End: ?

## 2021-06-27 MED ORDER — TRELEGY ELLIPTA 200 MCG-62.5 MCG-25 MCG POWDER FOR INHALATION
Freq: Every day | RESPIRATORY_TRACT | 6 refills | 0 days | Status: CP
Start: 2021-06-27 — End: ?

## 2021-06-27 MED ORDER — FLUTICASONE PROPIONATE 50 MCG/ACTUATION NASAL SPRAY,SUSPENSION
Freq: Every day | NASAL | 0 refills | 120 days | Status: CP
Start: 2021-06-27 — End: 2022-06-27

## 2021-06-27 NOTE — Unmapped (Signed)
Pulmonary Clinic - Follow-up Visit    Referring Physician :  Mal Amabile Kis*  PCP:     Lonell Face, MD  Reason for Consult:   Asthma-COPD overlap    ASSESSMENT and PLAN     Regina Vargas is a 71 y.o. female  with asthma-COPD overlap syndrome, HTN, HLD, thoracic scoliosis, mild bronchiectasis who is being followed for asthma-COPD and bronchiectasis    Frequent exacerbations is likely attributed to inadequate therapy and inadequate compliance with therapy as prescribed. In addition, her previous abnormal CT imaging suggest possible underlying aspiration, infection, and mild bronchiectasis. We will escalate therapy today to Trelegy 200 and albuterol nebs PRN. Of note, all-nebulized triple therapy was difficult to comply with previously. Anticipate that if she continues to do poorly on Trelegy 200 we may trial Breztri with spacer instead. She is pending initiation on Dupixent (elevated eos, IgE, and steroid dependence).     #Severe asthma with frequent exacerbations  - slightly better controlled on Trelegy 200 and albuterol PRN continue for now   - unfortunately still having frequent exacerbations  - plan to start Dupixent injection (coordinate with Daughter Para March)   - start flonase for component of sinusitis, if persistent can consider referral to ENT  - sleep study with CPAP for component of OSA (prev sleep study in 2020 at OSH needs titration study)  - perennial allergen panel today  - tessalon perles for cough    #Bronchiectasis likely 2/2 severe asthma, possible component of aspiration  - bronchiectasis workup overall negative aside from elevated RF with negative CCP, will repeat today  - albuterol, 7% HTS, and aerobika ordered will get education with RT on video call (coordinated with Lulu Riding)    - will ensure daughter Para March is present, she manages her mother's health    #OSA not treated  - 2020 sleep study at OSH noted OSA recommend lifestyle modification versus CPAP  - repeat sleep study with PAP titration ordered and pending    #Health maintenance  - due for COVID booster but unfortunately we ran out of COVID vaccines today, readdress next visit    Plan of care was discussed with the patient who acknowledged understanding and is in agreement.  Patient will return to clinic in 3 months  or sooner if needed.  This patient was seen and discussed with attending physician, Dr. Cordie Grice  who agrees with the assessment and plan above.     Regina Lively, MD  Pulmonary and Critical Care Fellow  Personal Pager: [phone] - 24-hr consult pager: [phone]    HISTORY:     History of Present Illness:  Regina Vargas is a 71 y.o. female with a history of asthma-COPD overlap syndrome, HTN, HLD, thoracic scoliosis, mild bronchiectasis who is being followed for asthma-COPD and bronchiectasis    Interval History  - last seen in clinic Dec 2022, history of difficulty with follow-up  - at that time, noted severe asthma with frequent exacerbation wanted to escalate therapy   - Trelegy 200 + albuterol PRN response is good   - prev difficulty complying with all nebulized medications (daughter helps out) but can try again if Trelegy fails   - planning to start dupixent today, daughter to bring epi-pen  - also noted bronchiectasis   - bronchiectasis labs overall negative    - after risk/benefit discussion opted not to complete bronchoscopy  - RF positive but CCP negative  - she is doing well with good breathing symptoms on current regiment  -  last exacerbation - 06/13/21 prednisone after weather changes noted worse cough and wheezing. PCP was also concerned about pneumonia but did not give antibiotics.     #Severe asthma with frequent exacerbations  - last exacerbation - 02/2021 hospitalized  - last PFT - 11/2019 with severe obstruction and BD response   - last imaging: - 05/2021 CT chest with improved centrilobular micronodularity, persistent stable diffuse bronchial wall thickening and mild bronchiectasis  - last cultures: none, today  - Activity Limitation -- can walk up a flight of stairs, will offer pulmonary rehab  - Intubated - none  - Fixed obstruction -- yes   - Smoking -- not anymore (previous smoked as a teenager)  - Eos -- 900 in 12/2020, IgE 745, amenable to biologics   - ABPA negative 01/2021  - Cough variant symptoms: - GERD - denies symptoms, upper airway disease - denies  - Occupational Asthma symptoms: (hx of animal/plant allergens, latex, grain, farming, animal workers, baking, polyurethane products)    - has not been tested for allergies before, no animals, does not know if she has down feathers at home   - Aspirin Exacerbated Respiratory Disease : does not take aspirin for years no nasal polyps  - Sinus disease - no sinus disease  - Vocal cord dysfunction - none  - Obesity related asthma possible  - Current therapy - uses albuterol daily, symbicort (but not uses it twice a day)    #Mild bronchiectasis likely 2/2 recurrent aspirations  #Bronchiectasis  - cause: biologic children?    - CF: unlikely given lack of other CF manifestations, discussed with Dr. Garner Nash low yield of test   - PCD: nNO low suspicion, will wait   - A1AT: negative   - ANCA: negative   - Autoimmune: positive RF will need CCP   - ABPA  Collect today  Lab Results   Component Value Date/Time    WBC 12.4 (H) 05/02/2021 04:26 PM    EOSABS 0.0 05/02/2021 04:26 PM      - Immunocompromised: see below   - recurrent infections - yes   - recurrent aspiration denies, but will re-examine  - culture: unable to obtain during sputum induction 01/2021 and hospitalization 02/2021   - discuss repeat imaging and bronchoscopy  - airway clearance 7% HTS and aerobika, needs education during april visit  - last PFT Sept 2022, obstruction, BD response, mildly reduced DLCO and normal lung volumes, needs  - last imaging Dec 2022  - last exacerbation - Dec 2022  - immunization reviewed, candidate for COVID booster  - labs  IgG with subclasses: reassuring, although need IgM  Lab Results   Component Value Date/Time    IGGT Test Not Performed 05/02/2021 04:26 PM    IGGT 1340 05/02/2021 04:26 PM    IGG1 Test Not Performed 05/02/2021 04:26 PM    IGG1 803 05/02/2021 04:26 PM    IGG2 Test Not Performed 05/02/2021 04:26 PM    IGG2 386 05/02/2021 04:26 PM    IGG3 Test Not Performed 05/02/2021 04:26 PM    IGG3 34.2 05/02/2021 04:26 PM    IGG4 Test Not Performed 05/02/2021 04:26 PM    IGG4 178.0 (H) 05/02/2021 04:26 PM     IgA:   Lab Results   Component Value Date/Time    IGA 318.2 05/02/2021 04:26 PM     IgM: No results found for: IGM  IgE:   Lab Results   Component Value Date/Time    IGE 484 (H) 05/02/2021  04:26 PM     HIV: No results found for: HIV  CBC-D:   Lab Results   Component Value Date/Time    WBC 12.4 (H) 05/02/2021 04:26 PM    HGB 14.7 05/02/2021 04:26 PM    HCT 44.6 (H) 05/02/2021 04:26 PM    PLT 598 (H) 05/02/2021 04:26 PM    NEUTROABS 10.5 (H) 05/02/2021 04:26 PM    LYMPHSABS 1.6 05/02/2021 04:26 PM    EOSABS 0.0 05/02/2021 04:26 PM    BASOSABS 0.1 05/02/2021 04:26 PM     #obesity  - sleep well, does not snore, not c/w OSA  - doesn't know if she snore or apneic episodes at night  - uses stationary bike for exercise 2x a week    #Memory problems, speech problems  - college educated  - MRI in March     Past Medical History:  Past Medical History:   Diagnosis Date   ??? Asthma    ??? Colon polyps 03/04/2010    hyperplastic   ??? History of anxiety    ??? Hypertension      Past Surgical History:   Procedure Laterality Date   ??? none     ??? PR COLONOSCOPY FLX DX W/COLLJ SPEC WHEN PFRMD N/A 11/25/2013    Procedure: COLONOSCOPY, FLEXIBLE, PROXIMAL TO SPLENIC FLEXURE; DIAGNOSTIC, W/WO COLLECTION SPECIMEN BY BRUSH OR WASH;  Surgeon: Dewaine Conger, MD;  Location: [facility];  Service: Gastroenterology   ??? TUBAL LIGATION         Other History:  The social history and family history were personally reviewed and updated in the patient's electronic medical record.    Social history  - non-smoker, used to smoke as a teenager (1 year at most)  - no alcohol  - no drug use    Home Medications:  Current Outpatient Medications on File Prior to Visit   Medication Sig Dispense Refill   ??? albuterol 2.5 mg /3 mL (0.083 %) nebulizer solution Inhale 3 mL (2.5 mg total) by nebulization every six (6) hours as needed for wheezing. 60 mL 3   ??? atorvastatin (LIPITOR) 40 MG tablet Take 1 tablet (40 mg total) by mouth daily. 90 tablet 3   ??? azithromycin (ZITHROMAX) 250 MG tablet Take one tablet (250 mg) by mouth daily for three days     ??? benzonatate (TESSALON) 100 MG capsule Take 1 capsule (100 mg total) by mouth.     ??? cetirizine (ZYRTEC) 10 MG tablet Take 1 tablet (10 mg total) by mouth daily. 30 tablet 2   ??? [EXPIRED] dupilumab 300 mg/2 mL PnIj Inject the contents of 2 pens (600 mg total) under the skin once for 1 dose. 4 mL 0   ??? dupilumab 300 mg/2 mL PnIj Inject the contents of 1 pen (300 mg total) under the skin every fourteen (14) days. 4 mL 3   ??? EPINEPHrine (EPIPEN) 0.3 mg/0.3 mL injection Inject 0.3 mL (0.3 mg total) into the muscle once for 1 dose for severe allergic reaction. 2 each 0   ??? fluticasone-umeclidin-vilanter (TRELEGY ELLIPTA) 200-62.5-25 mcg DsDv Inhale 1 puff  in the morning. 180 each 0   ??? inhalational spacing device (AEROCHAMBER MV) Spcr 1 each by Miscellaneous route in the morning. 1 each 0   ??? inhalational spacing device (AEROCHAMBER MV) Spcr 1 each by Miscellaneous route daily as needed. 1 each 0   ??? loratadine (CLARITIN) 10 mg tablet Take 1 tablet (10 mg total) by mouth  daily. 90 tablet 3   ??? losartan-hydrochlorothiazide (HYZAAR) 100-25 mg per tablet Take 1 tablet by mouth daily. 30 tablet 0   ??? montelukast (SINGULAIR) 10 mg tablet Take 1 tablet (10 mg total) by mouth daily. 90 tablet 3   ??? omeprazole (PRILOSEC) 20 MG capsule Take 1 capsule (20 mg total) by mouth daily.     ??? [EXPIRED] predniSONE (DELTASONE) 20 MG tablet Take 2 tablets (40 mg total) by mouth daily for 5 days. 10 tablet 0   ??? sodium chloride 7% 7 % Nebu Inhale 3 mL by nebulization Two (2) times a day for 90 doses. Please always use after albuterol nebs. Use twice a day to start. 240 mL 3   ??? [DISCONTINUED] amLODIPine (NORVASC) 5 MG tablet Take 1 tablet (5 mg total) by mouth daily. 30 tablet 0     Current Facility-Administered Medications on File Prior to Visit   Medication Dose Route Frequency Provider Last Rate Last Admin   ??? albuterol (ACCUNEB) nebulizer solution 0.63 mg  0.63 mg Nebulization Once Regina Lively, MD       ??? albuterol nebulizer solution 2.5 mg  2.5 mg Nebulization Once Ronney Asters, MD           Allergies:  Allergies as of 06/27/2021 - Reviewed 05/09/2021   Allergen Reaction Noted   ??? Hydralazine-reserpin-hcthiazid Rash 06/21/2015   ??? Shellfish containing products Rash 05/06/2017       Review of Systems:  A comprehensive review of systems was completed and negative except as noted in HPI.    PHYSICAL EXAM:     There were no vitals filed for this visit.  General: Alert and oriented, no acute distress  HEENT: MMM, clear oropharynx  CV: RRR, no m/r/g  Lungs: CTAB, no increased work of breathing  Abd: Soft, NT, ND, no rebound or guarding  Ext: Warm, well perfused, no peripheral edema  Skin: No rashes  Neuro: No focal deficits    LABORATORY and RADIOLOGY DATA:     Pulmonary Function Tests/Interpretation:  11/2019 PFT   - severe obstruction w/ bronchodilator response  - unreliable lung volumes and DLCO    05/2021  No desaturation    Pertinent Laboratory Data:  - no baseline labs    Pertinent Imaging Data:    05/2021 CT chest  1. Relative to the comparison CT scan the chest from February 07, 2021, this study demonstrates slight improvement in the diffuse centrilobular micronodularity involving all lobes of both lungs. As before, these changes are associated with diffuse bronchial wall thickening and mild bronchiectasis. The findings are suggestive of chronic infection.   ??   2. A moderate burden of calcified atherosclerotic disease is evident within the coronary arterial distribution.     01/2021 CXR  The lungs are clear with no pleural fluid and a normal cardiomediastinal contour. Significant scoliosis..      10/2020 CT chest  Bronchiectasis, bronchiolectasis, and tree-in-bud opacities. Differential considerations include recurrent aspiration and indolent indolent or resolved infection. ABPA is a less likely consideration due to absence of varicose bronchi        05/2020 CTA chest   1. No pulmonary embolism as clinically questioned.  2. Bronchial wall thickening and mild bronchiectasis involving the bilateral lower lobes with scattered mucous/debris filled airways likely represents sequelae of bronchitis. Recommend follow-up CT in 3 months to ensure resolution.    02/2020 TTE   1. The left ventricle is normal in size with mildly increased wall  thickness.  2. The left ventricular systolic function is hyperdynamic, LVEF is visually  estimated at >70%.    3. There is grade I diastolic dysfunction (impaired relaxation).    4. The right ventricle is normal in size, with normal systolic function.    5. Pulmonary systolic pressure cannot be estimated due to insufficient TR  jet.  ??  09/2019 CT chest  Diffuse bronchial wall thickening and occasional mucus impaction compatible with known asthma.  No acute infectious process    06/2019 MRI brain at [facility]:   Normal appearance of the brain itself.     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     Mucosal inflammatory changes of the ethmoid and sphenoid sinuses. No   advanced finding. This could conceivably relate headache. The   patient has had some chronic inflammation in this region over the   last several scans however, and today's findings are not as advanced   as on some of the previous studies. bilateral lower lobes with scattered mucous/debris filled airways likely represents sequelae of bronchitis. Recommend follow-up CT in 3 months to ensure resolution.    02/2020 TTE   1. The left ventricle is normal in size with mildly increased wall  thickness.    2. The left ventricular systolic function is hyperdynamic, LVEF is visually  estimated at >70%.    3. There is grade I diastolic dysfunction (impaired relaxation).    4. The right ventricle is normal in size, with normal systolic function.    5. Pulmonary systolic pressure cannot be estimated due to insufficient TR  jet.     09/2019 CT chest  Diffuse bronchial wall thickening and occasional mucus impaction compatible with known asthma.  No acute infectious process    06/2019 MRI brain at [facility]:   Normal appearance of the brain itself.     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     Mucosal inflammatory changes of the ethmoid and sphenoid sinuses. No   advanced finding. This could conceivably relate headache. The   patient has had some chronic inflammation in this region over the   last several scans however, and today's findings are not as advanced   as on some of the previous studies.

## 2021-06-27 NOTE — Unmapped (Addendum)
Sleep Study Scheduling: Please call [phone] to schedule your sleep study    Inhaler teaching:  [email or web address] - link at the bottom includes inhaler voucher  https://www.copdfoundation.org/Learn-More/Educational-Materials-Resources/Educational-Video-Series.aspx - inhaler teaching  AutomobileRetailer.it - inhaler teaching    COPD/asthma resources:  [facility] website for asthma/COPD care. The links at the bottom are very helpful:   [email or web address]  COPD foundation for inhaler teaching. Also applies for asthma: https://www.copdfoundation.org/Learn-More/Educational-Materials-Resources/Educational-Video-Series.aspx  [employer] for inhaler teaching  AutomobileRetailer.it  Inhaler voucher's list  [employer].co.za    Virtual Pulmonary Rehab:  What is Virtual Pulmonary Rehabilitation?   You interact with a licensed assigned practitioner that is well seasoned in the medical field. He/she sees you through your TV or [employer] (Phone, Tablet, Health and safety inspector). It's just like having the practitioner there with you in your presence! You and your assigned clinician will workout in group therapy learning over 1700+ therapies and exercises right from your home. [facility]) holds one of the highest success rates in rehabilitation history holding a 98.7% success rate! [facility] is the first virtual telemedicine company that offers Pulmonary & Cardiac Rehabilitation offered to the [place] which began in 2017.   Go to www.[email or web address] for information.   You can try out the program before committing to the program.        ---------------------------------------------------------------------------------------------------------------------------------------------------------------------------------------------------------------------------------------------------    Please take your medications as prescribed.  Thank you for allowing me to be a part of your care.  Please call the clinic with any questions.    Rosary Lively, MD  Pulmonary and Critical Care Medicine  [address]  [id number]  [place], [place] [address]    Between appointments, you can reach [place] at these numbers:    For appointments or the Pulmonary Nurse: [phone]  Fax: [phone]    For urgent issues after hours:  Hospital Operator: [phone], ask for Pulmonary Fellow on call    Some general information about contacting us:  - We will do our best to respond as quickly as possible, as your message is important to [place]. However, many of our providers have other duties (inpatient hospital work, Producer, television/film/video activities, teaching) that may make them unavailable for same day responses.   - For phone calls or MyChart messages, it may take 2-3 business days for you to hear back from our clinic. If your doctor is working in the hospital when a message is sent, a response from your doctor may be delayed. We apologize in advance for any delays.  - MyChart should not be used for issues that require same day response.  - If you have an urgent issue that you feel needs a response the same day, you should also contact your primary care provider or be evaluated at an Urgent Care clinic.  - If you have sent a MyChart message to the clinic and have not received a response after 2-3 business days, please call us at [phone] to speak to a nurse.

## 2021-06-29 ENCOUNTER — Institutional Professional Consult (permissible substitution): Admit: 2021-06-29 | Discharge: 2021-06-30 | Payer: MEDICARE

## 2021-06-29 NOTE — Unmapped (Signed)
Pt arrived to clinic for teaching of Dupixent. Pt was given education on the purpose of medication and was informed of potential side effects. Pt was given instruction of how to prep, inject, and dispose of the medication. Pt was able to verify understanding using the teach back method.     Pt was able to safely inject medication into their lef. No complications noted during injection. Pt stayed one hour/hours after injection for monitoring. No signs of moderate/severe reaction noted during stay.      All questions and concerns appropriately answered during visit. Pt instructed to call the clinic nurse line should they develop any questions or concerns regarding medication.       Nursing Assessment completed.   Drug allergies assessed.     Med supplied: [facility]-Patient Supplied     Drug Administered:  Dupixent 600mg  (loading dose)    Route: SubQ  Frequency: 14 days     Right lower abdomen: 300 mg  Lot #: [id number] / Exp 04/04/2023    Left lower abdomen: 300 mg  Lot #: [id number] / Exp 04/04/2023    Assessment:  Tolerated procedure well, no side effects. Patient has epi pen.   Patient aware to contact office if side effects occur.       Delice Lesch, RN

## 2021-06-29 NOTE — Unmapped (Signed)
Daughter, Para March, states her mom will receive first dose of Dupixent later today in clinic. We agreed to check in next week to schedule Dupixent delivery to home depending on how comfortable she is giving Dupixent injection today. She did not have any questions regarding Dupixent at this time.

## 2021-07-02 LAB — CYCLIC CITRUL PEPTIDE ANTIBODY, IGG
CCP ANTIBODIES: 1.1 {ELISA'U} (ref <7.0)
CCP IGG ANTIBODIES: NEGATIVE

## 2021-07-03 NOTE — Unmapped (Signed)
Called patient's daughter to try to set up an appointment for them to come in for airway clearance teaching. We were unable to find any times that would work for them.  I asked if she had reviewed the video that had been sent via MyChart and they had not.  She is going to review the video and see if that will help.  She is going to let us know if she still needs an appointment. I will try to see them when they come to their next pulmonology appointment if she doesn't come for an appointment.

## 2021-07-04 NOTE — Unmapped (Signed)
I saw and evaluated the patient, participating in the key portions of the service.  I reviewed the fellow's note.  I agree with the fellow???s findings and plan. Coy Saunas, MD

## 2021-07-09 LAB — PERENNIAL ALLERGEN PANEL IGE
CAT DANDER IGE: <0.35 kU/L (ref <0.35)
D. FARINAE IGE: 0.61 kU/L — ABNORMAL HIGH (ref <0.35)
D. PTERONYSSINUS IGE: 0.61 kU/L — ABNORMAL HIGH (ref <0.35)
DOG DANDER IGE: <0.35 kU/L (ref <0.35)
GERMAN COCKROACH IGE: 0.54 kU/L — ABNORMAL HIGH (ref <0.35)

## 2021-07-12 MED FILL — DUPIXENT 300 MG/2 ML SUBCUTANEOUS PEN INJECTOR: SUBCUTANEOUS | 28 days supply | Qty: 4 | Fill #0

## 2021-07-12 NOTE — Unmapped (Signed)
[facility] Clinical Assessment & Refill Coordination Note    Shantoya Geurts, DOB: [birth year]  Phone: [phone] (home)     All above HIPAA information was verified with patient's family member, daughter Para March).     Was a Nurse, learning disability used for this call? No    Specialty Medication(s):   CF/Pulmonary/Asthma: Dupixent     Current Outpatient Medications   Medication Sig Dispense Refill    albuterol 2.5 mg /3 mL (0.083 %) nebulizer solution Inhale 3 mL (2.5 mg total) by nebulization every six (6) hours as needed for wheezing. 60 mL 3    atorvastatin (LIPITOR) 40 MG tablet Take 1 tablet (40 mg total) by mouth daily. 90 tablet 3    benzonatate (TESSALON) 100 MG capsule Take 1 capsule (100 mg total) by mouth two (2) times a day as needed for cough. 20 capsule 0    cetirizine (ZYRTEC) 10 MG tablet Take 1 tablet (10 mg total) by mouth daily. 30 tablet 2    dupilumab 300 mg/2 mL PnIj Inject the contents of 1 pen (300 mg total) under the skin every fourteen (14) days. 4 mL 3    EPINEPHrine (EPIPEN) 0.3 mg/0.3 mL injection Inject 0.3 mL (0.3 mg total) into the muscle once for 1 dose for severe allergic reaction. 2 each 0    fluticasone propionate (FLONASE) 50 mcg/actuation nasal spray 1 spray into each nostril daily. 16 g 0    fluticasone-umeclidin-vilanter (TRELEGY ELLIPTA) 200-62.5-25 mcg DsDv Inhale 1 puff  in the morning. 180 each 6    inhalational spacing device (AEROCHAMBER MV) Spcr 1 each by Miscellaneous route in the morning. 1 each 0    inhalational spacing device (AEROCHAMBER MV) Spcr 1 each by Miscellaneous route daily as needed. 1 each 0    loratadine (CLARITIN) 10 mg tablet Take 1 tablet (10 mg total) by mouth daily. 90 tablet 3    losartan-hydrochlorothiazide (HYZAAR) 100-25 mg per tablet Take 1 tablet by mouth daily. 30 tablet 0    montelukast (SINGULAIR) 10 mg tablet Take 1 tablet (10 mg total) by mouth daily. 90 tablet 3    omeprazole (PRILOSEC) 20 MG capsule Take 1 capsule (20 mg total) by mouth daily.       Current Facility-Administered Medications   Medication Dose Route Frequency Provider Last Rate Last Admin    albuterol (ACCUNEB) nebulizer solution 0.63 mg  0.63 mg Nebulization Once Rosary Lively, MD        albuterol nebulizer solution 2.5 mg  2.5 mg Nebulization Once Ronney Asters, MD            Changes to medications: Rosabell reports no changes at this time.    Allergies   Allergen Reactions    Hydralazine-Reserpin-Hcthiazid Rash     Hypokalemia    Shellfish Containing Products Rash       Changes to allergies: No    SPECIALTY MEDICATION ADHERENCE     Dupixent 300  mg/81mL : 0 days of medicine on hand     Medication Adherence    Patient reported X missed doses in the last month: 0  Specialty Medication: Dupixent 300 mg/86mL - every 14 days  Patient is on additional specialty medications: No  Informant: child/children          Specialty medication(s) dose(s) confirmed: Regimen is correct and unchanged.     Are there any concerns with adherence? No    Adherence counseling provided? Not needed    CLINICAL MANAGEMENT AND INTERVENTION  Clinical Benefit Assessment:    Do you feel the medicine is effective or helping your condition? Yes    Clinical Benefit counseling provided? Not needed    Adverse Effects Assessment:    Are you experiencing any side effects? No    Are you experiencing difficulty administering your medicine? No    Quality of Life Assessment:    Quality of Life    Rheumatology  Oncology  Dermatology  Cystic Fibrosis          How many days over the past month did your asthma  keep you from your normal activities? For example, brushing your teeth or getting up in the morning. Daughter states that pt has less wheezing since starting Dupixent     Have you discussed this with your provider? Not needed    Acute Infection Status:    Acute infections noted within Epic:  No active infections  Patient reported infection:  Daughter states pt has a persistent cough- patient reported to provider    Therapy Appropriateness:    Is therapy appropriate and patient progressing towards therapeutic goals? Yes, therapy is appropriate and should be continued    DISEASE/MEDICATION-SPECIFIC INFORMATION      For patients on injectable medications: Patient currently has 0 doses left.  Next injection is scheduled for 5/12.    PATIENT SPECIFIC NEEDS     Does the patient have any physical, cognitive, or cultural barriers? No    Is the patient high risk? No    Does the patient require a Care Management Plan? No     SOCIAL DETERMINANTS OF HEALTH     At the [facility], we have learned that life circumstances - like trouble affording food, housing, utilities, or transportation can affect the health of many of our patients.   That is why we wanted to ask: are you currently experiencing any life circumstances that are negatively impacting your health and/or quality of life? No    Social Determinants of Psychologist, prison and probation services Strain: Low Risk     Difficulty of Paying Living Expenses: Not hard at all   Internet Connectivity: Not on file   Food Insecurity: No Food Insecurity    Worried About Programme researcher, broadcasting/film/video in the Last Year: Never true    Barista in the Last Year: Never true   Tobacco Use: Medium Risk    Smoking Tobacco Use: Former    Smokeless Tobacco Use: Never    Passive Exposure: Not on file   Housing/Utilities: Low Risk     Within the past 12 months, have you ever stayed: outside, in a car, in a tent, in an overnight shelter, or temporarily in someone else's home (i.e. couch-surfing)?: No    Are you worried about losing your housing?: No    Within the past 12 months, have you been unable to get utilities (heat, electricity) when it was really needed?: No   Alcohol Use: Not on file   Transportation Needs: No Transportation Needs    Lack of Transportation (Medical): No    Lack of Transportation (Non-Medical): No   Substance Use: Not on file   Health Literacy: Not on file Physical Activity: Not on file   Interpersonal Safety: Not on file   Stress: Not on file   Intimate Partner Violence: Not on file   Depression: Not at risk    PHQ-2 Score: 0   Social Connections: Not on file  Would you be willing to receive help with any of the needs that you have identified today? Not applicable       SHIPPING     Specialty Medication(s) to be Shipped:   CF/Pulmonary/Asthma: Dupixent    Other medication(s) to be shipped: No additional medications requested for fill at this time     Changes to insurance: No    Delivery Scheduled: Yes, Expected medication delivery date: 07/13/21.     Medication will be delivered via UPS to the confirmed prescription address in [facility].    The patient will receive a drug information handout for each medication shipped and additional FDA Medication Guides as required.  Verified that patient has previously received a Conservation officer, historic buildings and a Surveyor, mining.    The patient or caregiver noted above participated in the development of this care plan and knows that they can request review of or adjustments to the care plan at any time.      All of the patient's questions and concerns have been addressed.    Oliva Bustard   [facility] Specialty Pharmacist

## 2021-08-13 NOTE — Unmapped (Signed)
The [facility] has made a third and final attempt to reach this patient to refill the following medication: Dupixent 300mg /13ml Inj Pen.      We have left voicemails on the following phone numbers: [phone] [facility]) and have sent a MyChart message.    Dates contacted: 05/31, 06/5 & 06/12    Last scheduled delivery: 07/12/2021    The patient may be at risk of non-compliance with this medication. The patient should call the [facility] at [phone]  Option 4, then Option 2 (all other specialty patients) to refill medication.    Vietta Bonifield Leodis Binet   [facility] Specialty Technician

## 2021-09-12 NOTE — Unmapped (Signed)
[place] [facility] (CAMP) Clinic-- Medication Adherence  UNREACHABLE    Regina Vargas is a 71 y.o. female identified as having an adherence of <80%  for a hypertension medication, based on payer reports and chart review.       1st attempt call made to the patient's Home number(s). There was no answer & voicemail was left to return call.       Population[place]  [employer]    Medication(s):    Blood pressure: losartan/hydrochlorothiazide 100-25mg     Last Filled on 07/23/21   Refills remaining:YES        Additional Details & Actions Taken:   none      Marquis Buggy , CPhT  Certified Pharmacy Technician  [place] [facility] (CAMP)

## 2021-09-26 NOTE — Unmapped (Signed)
Adult Pulmonary Specialty Clinic Pharmacist Note     Purpose: Received notice that patient will be transitioning out of [facility] effective 10/02/21 where she was previously receiving Dupixent injections from their pharmacy. Provider from [facility] inquiring how to ensure there is no gap in coverage/missed doses of Dupixent since next dose is due on 10/04/21.     September 26, 2021 3:00 PM: Phone call to [facility]  Pharmacy technician reports since pharmacy is contracted, cannot give out patient specific information to writer  Called [facility] clinic twice, but both times was redirected and line either dropped or reached voicemail     September 26, 2021 3:12 PM: Phone call to  patient's daughter Para March)  Spoke with Para March  Confirmed her mother receives Dupixent from [facility] and daughter administers medication at home   Hopefully Medicare Part D plan will take effect by August 1st, planning to contact Medicare ASAP to ensure gets reinstated. Believes she may reinstate the same Part D plan as previous    Plan:   Recommended Torain contact [facility] pharmacy to see if Dupixent can be refilled prior to 8/1 since technically still in program and explain that otherwise mother may have coverage gap   Reviewed that clinic has previously done a PA on prior plan that was approved, but unclear if she will have same plan and if PA will be required again. Advised that PA process can take time, especially if requiring an appeal   CPP to send MyChart message to Sharrie Rothman Para March has access) so that she can message with plan details when plan becomes active to pass along to MAP team   Already has active Rx for Dupixent at [facility] with refills, no need to resend     Total time spent: 7 minutes     Electronically signed:  Prince Solian, PharmD, Patsy Baltimore, CPP  Clinical Pharmacist Practitioner  [facility]  [phone]    CC:   Tai Neill Loft, MD  Everlean Cherry (Medication Assistance Program Specialist)  Oliva Bustard, PharmD [facility] Clinical Pharmacist)

## 2021-10-02 DIAGNOSIS — J4551 Severe persistent asthma with (acute) exacerbation: Principal | ICD-10-CM

## 2021-10-17 DIAGNOSIS — J4551 Severe persistent asthma with (acute) exacerbation: Principal | ICD-10-CM

## 2021-10-17 MED ORDER — EMPTY CONTAINER
2 refills | 0 days
Start: 2021-10-17 — End: ?

## 2021-10-17 NOTE — Unmapped (Addendum)
[facility] Clinical Assessment & Refill Coordination Note    Daughter, Regina Vargas, states pt is 2 weeks past due for Dupixent dose but is overall responding to Dupixent well.     Regina Vargas, DOB: Nov 21, 1950  Phone: [phone] (home)     All above HIPAA information was verified with patient.     Was a Nurse, learning disability used for this call? No    Specialty Medication(s):   CF/Pulmonary/Asthma: Dupixent 300 mg/15mL     Current Outpatient Medications   Medication Sig Dispense Refill    albuterol 2.5 mg /3 mL (0.083 %) nebulizer solution Inhale 3 mL (2.5 mg total) by nebulization every six (6) hours as needed for wheezing. 60 mL 3    atorvastatin (LIPITOR) 40 MG tablet Take 1 tablet (40 mg total) by mouth daily. 90 tablet 3    benzonatate (TESSALON) 100 MG capsule Take 1 capsule (100 mg total) by mouth two (2) times a day as needed for cough. 20 capsule 0    cetirizine (ZYRTEC) 10 MG tablet Take 1 tablet (10 mg total) by mouth daily. 30 tablet 2    dupilumab 300 mg/2 mL PnIj Inject the contents of 1 pen (300 mg total) under the skin every fourteen (14) days. 4 mL 3    EPINEPHrine (EPIPEN) 0.3 mg/0.3 mL injection Inject 0.3 mL (0.3 mg total) into the muscle once for 1 dose for severe allergic reaction. 2 each 0    fluticasone propionate (FLONASE) 50 mcg/actuation nasal spray 1 spray into each nostril daily. 16 g 0    fluticasone-umeclidin-vilanter (TRELEGY ELLIPTA) 200-62.5-25 mcg DsDv Inhale 1 puff  in the morning. 180 each 6    inhalational spacing device (AEROCHAMBER MV) Spcr 1 each by Miscellaneous route in the morning. 1 each 0    inhalational spacing device (AEROCHAMBER MV) Spcr 1 each by Miscellaneous route daily as needed. 1 each 0    loratadine (CLARITIN) 10 mg tablet Take 1 tablet (10 mg total) by mouth daily. 90 tablet 3    losartan-hydrochlorothiazide (HYZAAR) 100-25 mg per tablet Take 1 tablet by mouth daily. 30 tablet 0    montelukast (SINGULAIR) 10 mg tablet Take 1 tablet (10 mg total) by mouth daily. 90 tablet 3    omeprazole (PRILOSEC) 20 MG capsule Take 1 capsule (20 mg total) by mouth daily.       Current Facility-Administered Medications   Medication Dose Route Frequency Provider Last Rate Last Admin    albuterol (ACCUNEB) nebulizer solution 0.63 mg  0.63 mg Nebulization Once Rosary Lively, MD        albuterol nebulizer solution 2.5 mg  2.5 mg Nebulization Once Ronney Asters, MD            Changes to medications: Akanksha reports no changes at this time.    Allergies   Allergen Reactions    Hydralazine-Reserpin-Hcthiazid Rash     Hypokalemia    Shellfish Containing Products Rash       Changes to allergies: No    SPECIALTY MEDICATION ADHERENCE     Dupixent 300  mg/12mL : 0 days of medicine on hand      Medication Adherence    Patient reported X missed doses in the last month: 1  Specialty Medication: Dupixent 300 mg/34mL Q14d  Patient is on additional specialty medications: No  Patient is on more than two specialty medications: No  Any gaps in refill history greater than 2 weeks in the last 3 months: no  Demonstrates  understanding of importance of adherence: yes  Informant: child/children                            Specialty medication(s) dose(s) confirmed: Regimen is correct and unchanged.     Are there any concerns with adherence? No - missed 1 dose while awaiting insurance approval.    Adherence counseling provided? Not needed    CLINICAL MANAGEMENT AND INTERVENTION      Clinical Benefit Assessment:    Do you feel the medicine is effective or helping your condition? Yes - Daughter states she is not needing to use her rescue inhaler since starting Dupixent    Clinical Benefit counseling provided? Not needed    Adverse Effects Assessment:    Are you experiencing any side effects? No    Are you experiencing difficulty administering your medicine? No    Quality of Life Assessment:    Quality of Life    Rheumatology  Oncology  Dermatology  Cystic Fibrosis          How many days over the past month did your asthma  keep you from your normal activities? For example, brushing your teeth or getting up in the morning. 0    Have you discussed this with your provider? Not needed    Acute Infection Status:    Acute infections noted within Epic:  No active infections  Patient reported infection: None    Therapy Appropriateness:    Is therapy appropriate and patient progressing towards therapeutic goals? Yes, therapy is appropriate and should be continued    DISEASE/MEDICATION-SPECIFIC INFORMATION      For patients on injectable medications: Patient currently has 0 doses left.  Next injection is scheduled for PAST DUE - will take once they receive it on 8/18.    PATIENT SPECIFIC NEEDS     Does the patient have any physical, cognitive, or cultural barriers? No    Is the patient high risk? No    Does the patient require a Care Management Plan? No     SOCIAL DETERMINANTS OF HEALTH     At the [facility], we have learned that life circumstances - like trouble affording food, housing, utilities, or transportation can affect the health of many of our patients.   That is why we wanted to ask: are you currently experiencing any life circumstances that are negatively impacting your health and/or quality of life? Yes - help with utility bills and sometimes with food    Social Determinants of Health     Financial Resource Strain: Low Risk  (12/27/2020)    Overall Financial Resource Strain (CARDIA)     Difficulty of Paying Living Expenses: Not hard at all   Internet Connectivity: Not on file   Food Insecurity: No Food Insecurity (12/27/2020)    Hunger Vital Sign     Worried About Running Out of Food in the Last Year: Never true     Ran Out of Food in the Last Year: Never true   Tobacco Use: Medium Risk (06/29/2021)    Patient History     Smoking Tobacco Use: Former     Smokeless Tobacco Use: Never     Passive Exposure: Not on file   Housing/Utilities: Low Risk  (12/27/2020)    Housing/Utilities     Within the past 12 months, have you ever stayed: outside, in a car, in a tent, in an overnight shelter, or temporarily in someone else's home (  i.e. couch-surfing)?: No     Are you worried about losing your housing?: No     Within the past 12 months, have you been unable to get utilities (heat, electricity) when it was really needed?: No   Alcohol Use: Not on file   Transportation Needs: No Transportation Needs (12/27/2020)    PRAPARE - Therapist, art (Medical): No     Lack of Transportation (Non-Medical): No   Substance Use: Not on file   Health Literacy: Not on file   Physical Activity: Not on file   Interpersonal Safety: Not on file   Stress: Not on file   Intimate Partner Violence: Not on file   Depression: Not at risk (12/07/2020)    PHQ-2     PHQ-2 Score: 0   Social Connections: Not on file       Would you be willing to receive help with any of the needs that you have identified today? Yes       SHIPPING     Specialty Medication(s) to be Shipped:   CF/Pulmonary/Asthma: Dupixent 300 mg/18mL    Other medication(s) to be shipped:  sharps container     Changes to insurance: No    Delivery Scheduled: Yes, Expected medication delivery date: 10/19/21.     Medication will be delivered via UPS to the confirmed prescription address in [facility].    The patient will receive a drug information handout for each medication shipped and additional FDA Medication Guides as required.  Verified that patient has previously received a Conservation officer, historic buildings and a Surveyor, mining.    The patient or caregiver noted above participated in the development of this care plan and knows that they can request review of or adjustments to the care plan at any time.      All of the patient's questions and concerns have been addressed.    Oliva Bustard   [facility] Specialty Pharmacist

## 2021-10-18 MED FILL — DUPIXENT 300 MG/2 ML SUBCUTANEOUS PEN INJECTOR: SUBCUTANEOUS | 28 days supply | Qty: 4 | Fill #1

## 2021-10-18 MED FILL — EMPTY CONTAINER: 120 days supply | Qty: 1 | Fill #0

## 2021-10-18 NOTE — Unmapped (Signed)
Addended by: Vernon Prey on: 10/17/2021 05:17 PM     Modules accepted: Orders

## 2021-10-22 ENCOUNTER — Ambulatory Visit: Admit: 2021-10-22 | Discharge: 2021-10-23

## 2021-10-22 DIAGNOSIS — J4551 Severe persistent asthma with (acute) exacerbation: Principal | ICD-10-CM

## 2021-10-23 NOTE — Unmapped (Signed)
COMMUNITY HEALTH WORKER  Outreach Note    10/22/2021    Situation:    Referral received from: Other: Pharmacist  Reason for referral: Resource Coordination    Background:    After speaking with patient, they state the following history about their needs:  Spoke with patient's daughter, Para March, who is inquiring about resources for patient that may help with her utilities payments. Para March denied food insecurity concerns at this time.     Assessment:    CHW discussed the following topics with patient:   Financial Needs    CHW gathered the following income & household size information:   N/A     CHW identified the following barriers to care: Financial Concerns    Recommendations:  CHW explained that local agencies, such as the Department of Engineer, site (DSS), and faith-based organizations may offer financial assistance for some utility payments, if funding is available and applicant qualifies for assistance. Para March stated she is familiar with the programs that DSS may offer and can further look into these on her own. CHW provided information to email ([email or web address] [email or web address]) for local organizations that may possibly assist with needs and encouraged reaching out to said resources directly for more information on assistance available.    Additional Comments/Actions:  Resources emailed included the following: [employer], [employer], [employer], [employer] [place], and [employer] [place].     Next Follow-up: N/A    Note Routed: Yes.  Routed to: Oliva Bustard, PharmD    Signed: Ahleah Simko  Ramirez-Torres

## 2021-10-30 NOTE — Unmapped (Signed)
Care Management Progress Note  [facility]             Purpose of contact:         Patient's daughter, Rene Kocher called SW about patient's housing.    Additional Information/Plan:  Rene Kocher said her mom's house is in the foreclosure stage, they have moved her out, and patient is living with her sister now (in [place]). Sister's home is safe, patient can stay as long as needed. Patient is able to live independently and family is interested in housing options; she has income (Tree surgeon).     SW discussed the following resources with Rene Kocher:  [employer]: [phone]  [place] Housing Search: [phone]  [place] [facility]: [phone]    Followed up with MyChart message as requested.     Patient provided my direct contact information and encouraged to contact me should additional needs arise.    Doretha Goding, LCSW, CCM  Population Health  [facility]  Ph: [phone]

## 2021-11-07 ENCOUNTER — Ambulatory Visit: Admit: 2021-11-07 | Discharge: 2021-11-08 | Payer: MEDICARE

## 2021-11-07 DIAGNOSIS — R1110 Vomiting, unspecified: Principal | ICD-10-CM

## 2021-11-07 DIAGNOSIS — E785 Hyperlipidemia, unspecified: Principal | ICD-10-CM

## 2021-11-07 DIAGNOSIS — I10 Essential (primary) hypertension: Principal | ICD-10-CM

## 2021-11-07 DIAGNOSIS — J4550 Severe persistent asthma, uncomplicated: Principal | ICD-10-CM

## 2021-11-07 DIAGNOSIS — J4541 Moderate persistent asthma with (acute) exacerbation: Principal | ICD-10-CM

## 2021-11-07 DIAGNOSIS — J329 Chronic sinusitis, unspecified: Principal | ICD-10-CM

## 2021-11-07 LAB — COMPREHENSIVE METABOLIC PANEL
ALBUMIN: 3.9 g/dL (ref 3.4–5.0)
ALKALINE PHOSPHATASE: 66 U/L (ref 46–116)
ALT (SGPT): 11 U/L (ref 10–49)
ANION GAP: 10 mmol/L (ref 5–14)
AST (SGOT): 19 U/L (ref <=34)
BILIRUBIN TOTAL: 0.5 mg/dL (ref 0.3–1.2)
BLOOD UREA NITROGEN: 17 mg/dL (ref 9–23)
BUN / CREAT RATIO: 16
CALCIUM: 9.8 mg/dL (ref 8.7–10.4)
CHLORIDE: 108 mmol/L — ABNORMAL HIGH (ref 98–107)
CO2: 30 mmol/L (ref 20.0–31.0)
CREATININE: 1.06 mg/dL — ABNORMAL HIGH
EGFR CKD-EPI (2021) FEMALE: 57 mL/min/{1.73_m2} — ABNORMAL LOW (ref >=60)
GLUCOSE RANDOM: 96 mg/dL (ref 70–179)
POTASSIUM: 3 mmol/L — ABNORMAL LOW (ref 3.4–4.8)
PROTEIN TOTAL: 7.8 g/dL (ref 5.7–8.2)
SODIUM: 148 mmol/L — ABNORMAL HIGH (ref 135–145)

## 2021-11-07 MED ORDER — LOSARTAN 100 MG-HYDROCHLOROTHIAZIDE 25 MG TABLET
ORAL_TABLET | Freq: Every day | ORAL | 0 refills | 30 days | Status: CP
Start: 2021-11-07 — End: 2021-12-07

## 2021-11-07 MED ORDER — MONTELUKAST 10 MG TABLET
ORAL_TABLET | Freq: Every day | ORAL | 3 refills | 90 days | Status: CP
Start: 2021-11-07 — End: 2022-11-02

## 2021-11-07 MED ORDER — FLUTICASONE PROPIONATE 50 MCG/ACTUATION NASAL SPRAY,SUSPENSION
Freq: Every day | NASAL | 0 refills | 120 days | Status: CP
Start: 2021-11-07 — End: 2022-11-07

## 2021-11-07 MED ORDER — TRELEGY ELLIPTA 200 MCG-62.5 MCG-25 MCG POWDER FOR INHALATION
Freq: Every day | RESPIRATORY_TRACT | 6 refills | 0 days | Status: CP
Start: 2021-11-07 — End: ?

## 2021-11-07 MED ORDER — ATORVASTATIN 40 MG TABLET
ORAL_TABLET | Freq: Every day | ORAL | 3 refills | 90 days | Status: CP
Start: 2021-11-07 — End: 2022-11-07

## 2021-11-07 MED ORDER — PREDNISONE 20 MG TABLET
ORAL_TABLET | Freq: Every day | ORAL | 0 refills | 5 days | Status: CP
Start: 2021-11-07 — End: 2021-11-12

## 2021-11-07 NOTE — Unmapped (Signed)
[facility]  Established Patient Clinic Note    Assessment/Plan:   Regina Vargas is a 71 y.o.female w/pmh of T2DM, bronchiectasis, asthma-COPD overlap, OSA, hypertension, GERD, pituitary adenoma, possible dementia, diastolic dysfunction    Asthma, bronchiectasis, possible current exacerbation  Patient reports shortness of breath not at baseline, she does have faint expiratory wheezing but overall ok air movement. Challenging historian given possible underlying dementia. Will treat with 5 day course of prednisone. Follows with pulm, overall doing better with less frequent exacerbations since starting dupixent but still overall difficult to control. Difficult social situation, appears PACE was attempted in the past, currently living with sister secondary to financial difficulties. Has not yet been able to stat airway clearance.   -currently on trelegy and dupixent injections  -pulm follow up later this month    Memory complaints  Per reviewing records with prior PCP from spring 2023 MOCA was 10, this is in media tab from 03/12/21. has appointment with neurology in November of this year. MRI in January 2023 showed chronic microvascular ischemic and pituitary adenoma which per chart review she has followed with [facility] for with reassuring initial lab workup.     Attending: Dr. Kizzie Bane    Subjective   Regina Vargas is a 71 y.o. female  coming to clinic today for the following issues:    Chief Complaint   Patient presents with   ??? Shortness of Breath     X   ??? Dizziness     HPI:    see assesment and plan for additional hpi details      I have reviewed the problem list, medications, and allergies and have updated/reconciled them if needed.    Regina Vargas  reports that she has quit smoking. Her smoking use included cigarettes. She has never used smokeless tobacco.  Health Maintenance   Topic Date Due   ??? Medicare Annual Wellness Visit (AWV)  Never done   ??? Zoster Vaccines (2 of 2) 10/22/2019   ??? COVID-19 Vaccine (3 - Moderna risk series) 12/02/2019   ??? DTaP/Tdap/Td Vaccines (2 - Td or Tdap) 02/23/2020   ??? Urine Albumin/Creatinine Ratio  08/26/2020   ??? Retinal Eye Exam  09/02/2020   ??? Influenza Vaccine (1) 11/02/2021   ??? Foot Exam  12/07/2021   ??? Hemoglobin A1c  12/13/2021   ??? Serum Creatinine Monitoring  11/15/2022   ??? Potassium Monitoring  11/15/2022   ??? Mammogram Start Age 54  01/20/2023   ??? Colon Cancer Screening  11/26/2023   ??? DEXA Scan  04/12/2024   ??? COPD Spirometry  11/14/2024   ??? Pneumococcal Vaccine 65+  Completed   ??? Hepatitis C Screen  Completed       Objective     VITALS: BP 146/75  - Pulse 85  - Temp 36.7 ??C (98 ??F) (Temporal)  - Ht 169 cm (5' 6.54)  - LMP  (LMP Unknown)  - SpO2 98%  - BMI 30.65 kg/m??     EXAM  GEN: chronically ill appearing  HEENT: Sclera anicteric, no conjunctival injection. No nasal discharge.   RESP: Breathing comfortably on room air. Expiratory wheezes  CV: RRR, nml S1, S2 w/o murmur. Extremities warm, well perfused.   GI: Nml bowel sounds, ND, NT.   EXT: Nml bulk and tone. No clubbing or edema.   SKIN: Non-jaundiced, no rash.         LABS/IMAGING  I have reviewed pertinent recent labs and imaging in Epic  [facility]  [place] [facility]  [address], [place], [place] [address] ??? Telephone [phone] ??? Fax [phone]  [email or web address]

## 2021-11-07 NOTE — Unmapped (Signed)
[facility] Refill Coordination Note    Specialty Medication(s) to be Shipped:   CF/Pulmonary/Asthma: Dupixent 300mg /25ml    Other medication(s) to be shipped: No additional medications requested for fill at this time     Regina Vargas, DOB: [birth year]  Phone: [phone] (home)       All above HIPAA information was verified with patient's caregiver, daughter      Was a Nurse, learning disability used for this call? No    Completed refill call assessment today to schedule patient's medication shipment from the [facility] [phone]).  All relevant notes have been reviewed.     Specialty medication(s) and dose(s) confirmed: Regimen is correct and unchanged.   Changes to medications: Regina Vargas reports no changes at this time.  Changes to insurance: No  New side effects reported not previously addressed with a pharmacist or physician: None reported  Questions for the pharmacist: No    Confirmed patient received a Conservation officer, historic buildings and a Surveyor, mining with first shipment. The patient will receive a drug information handout for each medication shipped and additional FDA Medication Guides as required.       DISEASE/MEDICATION-SPECIFIC INFORMATION        For patients on injectable medications: Patient currently has 1 doses left.  Next injection is scheduled for 11/07/21.    SPECIALTY MEDICATION ADHERENCE     Medication Adherence    Patient reported X missed doses in the last month: 0  Specialty Medication: DUPIXENT PEN 300 mg/2 mL Pnij (dupilumab)  Patient is on additional specialty medications: No  Patient is on more than two specialty medications: No  Informant: child/children  Reliability of informant: reliable  Provider-estimated medication adherence level: good  Reasons for non-adherence: no problems identified                                Were doses missed due to medication being on hold? No    DUPIXENT PEN 300 mg/2 mL Pnij (dupilumab)  : 14 days of medicine on hand REFERRAL TO PHARMACIST     Referral to the pharmacist: Not needed      [facility]     Shipping address confirmed in Epic.     Delivery Scheduled: Yes, Expected medication delivery date: 11/13/21.     Medication will be delivered via UPS to the prescription address in Epic WAM.    Syre Knerr' W Wilhemena Durie [facility] Specialty Technician

## 2021-11-12 MED FILL — DUPIXENT 300 MG/2 ML SUBCUTANEOUS PEN INJECTOR: SUBCUTANEOUS | 28 days supply | Qty: 4 | Fill #2

## 2021-11-14 ENCOUNTER — Other Ambulatory Visit: Payer: Self-pay

## 2021-11-14 ENCOUNTER — Emergency Department: Payer: 59

## 2021-11-14 ENCOUNTER — Emergency Department
Admission: EM | Admit: 2021-11-14 | Discharge: 2021-11-14 | Disposition: A | Discharge status: Home / Self Care | Payer: 59 | Attending: Emergency Medicine | Admitting: Emergency Medicine

## 2021-11-14 DIAGNOSIS — J449 Chronic obstructive pulmonary disease, unspecified: Secondary | ICD-10-CM | POA: Diagnosis not present

## 2021-11-14 DIAGNOSIS — R079 Chest pain, unspecified: Secondary | ICD-10-CM | POA: Diagnosis not present

## 2021-11-14 DIAGNOSIS — R109 Unspecified abdominal pain: Secondary | ICD-10-CM | POA: Insufficient documentation

## 2021-11-14 DIAGNOSIS — R519 Headache, unspecified: Secondary | ICD-10-CM | POA: Insufficient documentation

## 2021-11-14 DIAGNOSIS — R42 Dizziness and giddiness: Secondary | ICD-10-CM | POA: Insufficient documentation

## 2021-11-14 DIAGNOSIS — M542 Cervicalgia: Secondary | ICD-10-CM | POA: Insufficient documentation

## 2021-11-14 DIAGNOSIS — J45909 Unspecified asthma, uncomplicated: Secondary | ICD-10-CM | POA: Insufficient documentation

## 2021-11-14 DIAGNOSIS — F0390 Unspecified dementia without behavioral disturbance: Secondary | ICD-10-CM | POA: Insufficient documentation

## 2021-11-14 DIAGNOSIS — I10 Essential (primary) hypertension: Secondary | ICD-10-CM | POA: Diagnosis not present

## 2021-11-14 DIAGNOSIS — E119 Type 2 diabetes mellitus without complications: Secondary | ICD-10-CM | POA: Diagnosis not present

## 2021-11-14 LAB — CBC
HCT: 42.6 % (ref 36.0–46.0)
Hemoglobin: 13.9 g/dL (ref 12.0–15.0)
MCH: 27.5 pg (ref 26.0–34.0)
MCHC: 32.6 g/dL (ref 30.0–36.0)
MCV: 84.4 fL (ref 80.0–100.0)
Platelets: 547 10*3/uL — ABNORMAL HIGH (ref 150–400)
RBC: 5.05 MIL/uL (ref 3.87–5.11)
RDW: 12.9 % (ref 11.5–15.5)
WBC: 9.6 10*3/uL (ref 4.0–10.5)
nRBC: 0 % (ref 0.0–0.2)

## 2021-11-14 LAB — COMPREHENSIVE METABOLIC PANEL
ALT: 10 U/L (ref 0–44)
AST: 21 U/L (ref 15–41)
Albumin: 3.8 g/dL (ref 3.5–5.0)
Alkaline Phosphatase: 58 U/L (ref 38–126)
Anion gap: 9 (ref 5–15)
BUN: 15 mg/dL (ref 8–23)
CO2: 28 mmol/L (ref 22–32)
Calcium: 9.6 mg/dL (ref 8.9–10.3)
Chloride: 105 mmol/L (ref 98–111)
Creatinine, Ser: 0.88 mg/dL (ref 0.44–1.00)
GFR, Estimated: >60 mL/min (ref >60)
Glucose, Bld: 123 mg/dL — ABNORMAL HIGH (ref 70–99)
Potassium: 3.1 mmol/L — ABNORMAL LOW (ref 3.5–5.1)
Sodium: 142 mmol/L (ref 135–145)
Total Bilirubin: 0.5 mg/dL (ref 0.3–1.2)
Total Protein: 7.7 g/dL (ref 6.5–8.1)

## 2021-11-14 LAB — URINALYSIS, ROUTINE W REFLEX MICROSCOPIC
Bilirubin Urine: NEGATIVE
Glucose, UA: NEGATIVE mg/dL
Hgb urine dipstick: NEGATIVE
Ketones, ur: NEGATIVE mg/dL
Nitrite: NEGATIVE
Protein, ur: NEGATIVE mg/dL
Specific Gravity, Urine: 1.018 (ref 1.005–1.030)
pH: 8 (ref 5.0–8.0)

## 2021-11-14 LAB — BLOOD GAS, VENOUS
Acid-Base Excess: 8.5 mmol/L — ABNORMAL HIGH (ref 0.0–2.0)
Bicarbonate: 31.8 mmol/L — ABNORMAL HIGH (ref 20.0–28.0)
Patient temperature: 37
pCO2, Ven: 38 mmHg — ABNORMAL LOW (ref 44–60)
pH, Ven: 7.53 — ABNORMAL HIGH (ref 7.25–7.43)
pO2, Ven: 34 mmHg (ref 32–45)

## 2021-11-14 LAB — LIPASE, BLOOD: Lipase: 49 U/L (ref 11–51)

## 2021-11-14 LAB — TROPONIN I (HIGH SENSITIVITY): Troponin I (High Sensitivity): 2 ng/L (ref <18)

## 2021-11-14 MED ORDER — IOHEXOL 350 MG/ML SOLN
75.0000 mL | Freq: Once | INTRAVENOUS | Status: AC | PRN
  Administered 2021-11-14: 75 mL via INTRAVENOUS

## 2021-11-14 MED ORDER — POTASSIUM CHLORIDE CRYS ER 20 MEQ PO TBCR
40.0000 meq | EXTENDED_RELEASE_TABLET | Freq: Once | ORAL | Status: AC
  Administered 2021-11-14: 40 meq via ORAL
  Filled 2021-11-14: qty 2

## 2021-11-14 NOTE — ED Provider Notes (Signed)
[facility] Provider Note    Event Date/Time   First MD Initiated Contact with Patient 11/14/21 1500     (approximate)   History   Chief Complaint Abdominal Pain and Neck Pain   HPI  Alyssa Santiago is a 71 y.o. female with past medical history of hypertension, hyperlipidemia, COPD, diabetes, asthma, and dementia who presents to the ED complaining of neck pain.  Patient reports that she has been dealing with a couple of months of pain starting in the left side of her neck, moving upwards into her head as well as occasionally downwards into her chest.  She describes it as a dull aching that is not exacerbated or alleviated by anything, denies any recent trauma to her head or neck.  While she states the pain occasionally moves down into her chest, she denies any fevers, cough, or difficulty breathing.  She currently denies any pain in her chest and describes the pain in her neck as mild.  She states she has been feeling dizzy at times, is unsure whether she is feeling lightheaded or like the room is spinning around her.  She denies any vision changes, speech changes, numbness, or weakness.     Physical Exam   Triage Vital Signs: ED Triage Vitals  Enc Vitals Group     BP 11/14/21 1400 (!) 136/101     Pulse Rate 11/14/21 1400 84     Resp 11/14/21 1400 18     Temp 11/14/21 1400 98.3 F (36.8 C)     Temp Source 11/14/21 1400 Oral     SpO2 11/14/21 1400 99 %     Weight --      Height --      Head Circumference --      Peak Flow --      Pain Score 11/14/21 1401 10     Pain Loc --      Pain Edu? --      Excl. in [place]? --     Most recent vital signs: Vitals:   11/14/21 1848 11/14/21 1930  BP: (!) 152/101 (!) 153/84  Pulse: 79 64  Resp: 18 16  Temp:    SpO2: 99% 99%    Constitutional: Awake and alert. Eyes: Conjunctivae are normal.  Pupils equal, round, and reactive to light bilaterally. Head: Atraumatic. Nose: No  congestion/rhinnorhea. Mouth/Throat: Mucous membranes are moist.  Neck: Left neck tenderness to palpation noted, no midline tenderness.  Neck supple with no meningismus. Cardiovascular: Normal rate, regular rhythm. Grossly normal heart sounds.  2+ radial pulses bilaterally. Respiratory: Normal respiratory effort.  No retractions. Lungs CTAB. Gastrointestinal: Soft and nontender. No distention. Musculoskeletal: No lower extremity tenderness nor edema.  Neurologic:  Normal speech and language. No gross focal neurologic deficits are appreciated.    ED Results / Procedures / Treatments   Labs (all labs ordered are listed, but only abnormal results are displayed) Labs Reviewed  COMPREHENSIVE METABOLIC PANEL - Abnormal; Notable for the following components:      Result Value   Potassium 3.1 (*)    Glucose, Bld 123 (*)    All other components within normal limits  CBC - Abnormal; Notable for the following components:   Platelets 547 (*)    All other components within normal limits  URINALYSIS, ROUTINE W REFLEX MICROSCOPIC - Abnormal; Notable for the following components:   Color, Urine YELLOW (*)    APPearance CLEAR (*)    Leukocytes,Ua TRACE (*)    Bacteria, UA  RARE (*)    All other components within normal limits  BLOOD GAS, VENOUS - Abnormal; Notable for the following components:   pH, Ven 7.53 (*)    pCO2, Ven 38 (*)    Bicarbonate 31.8 (*)    Acid-Base Excess 8.5 (*)    All other components within normal limits  LIPASE, BLOOD  TROPONIN I (HIGH SENSITIVITY)     EKG  ED ECG REPORT I, Blake Divine, the attending physician, personally viewed and interpreted this ECG.   Date: 11/14/2021  EKG Time: 16:46  Rate: 56  Rhythm: sinus bradycardia  Axis: Normal  Intervals:first-degree A-V block   ST&T Change: None  RADIOLOGY Chest x-ray reviewed and interpreted by me with no infiltrate, edema, or effusion.  PROCEDURES:  Critical Care performed:  No  Procedures   MEDICATIONS ORDERED IN ED: Medications  potassium chloride SA (KLOR-CON M) CR tablet 40 mEq (40 mEq Oral Given 11/14/21 1728)  iohexol (OMNIPAQUE) 350 MG/ML injection 75 mL (75 mLs Intravenous Contrast Given 11/14/21 1804)     IMPRESSION / MDM / ASSESSMENT AND PLAN / ED COURSE  I reviewed the triage vital signs and the nursing notes.                              71 y.o. female with past medical history of hypertension, hyperlipidemia, diabetes, COPD, asthma, and dementia who presents to the ED with a couple months of intermittent pain in her left neck that radiates up towards her head and chest along with some dizziness.  Patient's presentation is most consistent with acute presentation with potential threat to life or bodily function.  Differential diagnosis includes, but is not limited to, cervical strain, cervical radiculopathy, vertebral artery dissection, carotid dissection, migraine, ACS, PE, pneumonia, aortic dissection, electrolyte abnormality, anemia.  Patient nontoxic-appearing and in no acute distress, vital signs are unremarkable and she appears at her baseline mental status with no focal neurologic deficits.  I was able to speak with patient's daughter over the phone, who confirms the history provided by patient.  With her dizziness and ongoing neck pain, we will check CTA of her head and neck to ensure no vertebral artery dissection or carotid artery dissection.  Low suspicion for stroke or SAH at this time, especially given intermittent pain for multiple months.  Labs are reassuring, remarkable only for mild hypokalemia with no significant AKI, anemia, leukocytosis.  LFTs and lipase are within normal limits and patient currently denies any abdominal pain with a benign exam.  Patient declines pain medication at this time, we will reassess following imaging.  With her chest pain we will also check EKG, chest x-ray, and add on troponin.  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  On reassessment, she is feeling well with minimal headache or neck pain and is appropriate for discharge home with PCP follow-up.  Patient counseled to return to the ED for new or worsening symptoms, patient agrees with plan.      FINAL CLINICAL IMPRESSION(S) / ED DIAGNOSES   Final diagnoses:  Neck pain  Acute nonintractable headache, unspecified headache type  Chest pain, unspecified type     Rx / DC Orders   ED Discharge Orders     None        Note:  This document was prepared using  Dragon Armed forces training and education officer and may include unintentional dictation errors.   Blake Divine, MD 11/14/21 2018

## 2021-11-14 NOTE — ED Triage Notes (Signed)
First Nurse Note:  Pt via EMS from home. Pt states she woke up this AM since 0700, pt c/o L sided pain. Pt had an ichy hot patch to the L side of her neck. Pt has hx of dementia. Pt is A&Ox4 and NAD  138/87 100% on RA 79 HR

## 2021-11-14 NOTE — ED Triage Notes (Addendum)
Pt to ED via [facility] from home. Pt states lower abdominal pain, left sided neck pain and left sided head pain x2-3 months. Pt reports she has been having memory issues as well. Pt denies N/V/D or CP.  Pt has icy hot patch to left side of neck placed by daughter.

## 2021-11-14 NOTE — ED Notes (Signed)
Called daughter Jeanett Schlein to pick up pt

## 2021-11-27 NOTE — Unmapped (Signed)
Pulmonary Clinic - Follow-up Visit    Referring Physician :  Mal Amabile [facility]*  PCP:     Judie Petit, MD  Reason for Consult:   Asthma-COPD overlap    ASSESSMENT and PLAN     Regina Vargas is a 71 y.o. female  with asthma-COPD overlap syndrome, HTN, HLD, thoracic scoliosis, mild bronchiectasis who is being followed for asthma-COPD and bronchiectasis    Frequent exacerbations is likely attributed to inadequate therapy and inadequate compliance with therapy as prescribed. In addition, her previous abnormal CT imaging suggest possible underlying aspiration, infection, and mild bronchiectasis. We will escalate therapy today to Trelegy 200 and albuterol nebs PRN. Of note, all-nebulized triple therapy was difficult to comply with previously. Anticipate that if she continues to do poorly on Trelegy 200 we may trial Breztri with spacer instead. She is pending initiation on Dupixent (elevated eos, IgE, and steroid dependence).     #Severe asthma with frequent exacerbations  - slightly better controlled on Trelegy 200 and albuterol PRN continue for now   - unfortunately still having frequent exacerbations  - plan to start Dupixent injection (coordinate with Daughter Para March)   - start flonase for component of sinusitis, if persistent can consider referral to ENT  - sleep study with CPAP for component of OSA (prev sleep study in 2020 at OSH needs titration study)  - perennial allergen panel today  - tessalon perles for cough    #Bronchiectasis likely 2/2 severe asthma, possible component of aspiration  - bronchiectasis workup overall negative aside from elevated RF with negative CCP, will repeat today  - albuterol, 7% HTS, and aerobika ordered will get education with RT on video call (coordinated with Lulu Riding)    - will ensure daughter Para March is present, she manages her mother's health    #OSA not treated  - 2020 sleep study at OSH noted OSA recommend lifestyle modification versus CPAP  - repeat sleep study with PAP titration ordered and pending    #Health maintenance  - due for COVID booster but unfortunately we ran out of COVID vaccines today, readdress next visit    Plan of care was discussed with the patient who acknowledged understanding and is in agreement.  Patient will return to clinic in 3 months  or sooner if needed.  This patient was seen and discussed with attending physician, Dr. Marland Kitchen who agrees with the assessment and plan above.     Rosary Lively, MD  Pulmonary and Critical Care Fellow  Personal Pager: [phone] - 24-hr consult pager: [phone]    HISTORY:     History of Present Illness:  Regina Vargas is a 71 y.o. female with a history of asthma-COPD overlap syndrome, HTN, HLD, thoracic scoliosis, mild bronchiectasis who is being followed for asthma-COPD and bronchiectasis    Interval History  - last seen in clinic April 2023, history of difficulty with follow-up  - at that time noted severe asthma with frequent exacerbation, continued on Trelegy and added Dupixent started 06/29/21  - 06/2021 labs also notable for - RF elevation but negative CCP, positive perennial allergen panel   - consider allergy referral   - on singulair and flonase for sinusitis   - consider ENT referral for sinusitis   - consider rheum referral  - attempted to give education about airway clearance for bronchiectasis secondary to obstructive lung disease   - prescribed hypertonic saline, albuterol, and aerobika   - supplies received ***, started treatment ***  - also noted OSA needed  CPAP titration, ordered April 2023 ***  - due for and COVID vaccine  - PACE program experience ***        #Severe asthma with frequent exacerbations  - last exacerbation - 02/2021 hospitalized  - last PFT - 11/2019 with severe obstruction and BD response   - last imaging: - 05/2021 CT chest with improved centrilobular micronodularity, persistent stable diffuse bronchial wall thickening and mild bronchiectasis  - last cultures: none, unable to get sputum during Nov 2022 clinic appt or Dec 2022  Hospitalization, after risk/benefit discussion decided not to undergo bronch  - current regiment: Trelegy 200 + duoneb PRN + starting dupixent today  - Activity Limitation -- can walk up a flight of stairs, previous pulm rehab referral did not work due to distance, about to start PACE program for seniors (with physical therapy in-house)  - Intubated - none  - Fixed obstruction -- yes   - Smoking -- not anymore (previous smoked as a teenager)  - Eos -- 900 in 12/2020, IgE 745, amenable to biologics   - ABPA negative 01/2021  - Cough variant symptoms: - GERD - denies symptoms, upper airway disease - denies  - Occupational Asthma symptoms: (hx of animal/plant allergens, latex, grain, farming, animal workers, baking, polyurethane products)    - has not been tested for allergies before, no animals, does not know if she has down feathers at home   - Aspirin Exacerbated Respiratory Disease : does not take aspirin for years no nasal polyps  - Sinus disease - no sinus disease  - Vocal cord dysfunction - none  - Obesity related asthma possible  - Current therapy - uses albuterol daily, symbicort (but not uses it twice a day)    #Mild bronchiectasis likely 2/2 recurrent aspirations  #Bronchiectasis  - cause: biologic children?    - CF: unlikely given lack of other CF manifestations, discussed with Dr. Garner Nash low yield of test   - PCD: nNO low suspicion, will wait   - A1AT: negative   - ANCA: negative   - Autoimmune: positive RF will need CCP   - ABPA  Collect today  Lab Results   Component Value Date/Time    WBC 12.4 (H) 05/02/2021 04:26 PM    EOSABS 0.0 05/02/2021 04:26 PM      - Immunocompromised: see below   - recurrent infections - yes   - recurrent aspiration denies, but will re-examine  - culture: unable to obtain during sputum induction 01/2021 and hospitalization 02/2021   - discuss repeat imaging and bronchoscopy  - airway clearance 7% HTS and aerobika, needs education during april visit  - last PFT Sept 2022, obstruction, BD response, mildly reduced DLCO and normal lung volumes, needs  - last imaging Dec 2022  - last exacerbation - Dec 2022  - immunization reviewed, candidate for COVID booster  Immunization History   Administered Date(s) Administered    COVID-19 VACCINE,MRNA(MODERNA)(PF) 10/07/2019, 11/04/2019    INFLUENZA QUAD ADJUVANTED 41YR UP(FLUAD) 12/07/2020, 02/25/2021    INFLUENZA QUAD HIGH DOSE 41YRS+(FLUZONE) 12/19/2017    INFLUENZA TIV (TRI) PF (IM) 01/22/1999, 02/07/2006, 03/30/2007, 12/24/2007, 12/01/2009, 03/01/2011, 12/19/2011, 01/01/2013    Influenza Vaccine Quad (IIV4 PF) 81mo+ injectable 01/07/2014, 11/09/2014, 11/20/2015, 12/23/2016, 11/03/2018, 02/10/2020    Influenza Virus Vaccine, unspecified formulation 01/07/2014    PNEUMOCOCCAL POLYSACCHARIDE 23 03/02/2013, 10/22/2013, 03/23/2019, 02/24/2021    Pneumococcal Conjugate 13-Valent 12/25/2017    SHINGRIX-ZOSTER VACCINE (HZV), RECOMBINANT,SUB-UNIT,ADJUVANTED IM 08/27/2019    TdaP 02/22/2010    ZOSTAVAX -  ZOSTER VACCINE, LIVE, SQ 04/18/2015         - labs  IgG with subclasses: reassuring, although need IgM  Lab Results   Component Value Date/Time    IGGT Test Not Performed 05/02/2021 04:26 PM    IGGT 1340 05/02/2021 04:26 PM    IGG1 Test Not Performed 05/02/2021 04:26 PM    IGG1 803 05/02/2021 04:26 PM    IGG2 Test Not Performed 05/02/2021 04:26 PM    IGG2 386 05/02/2021 04:26 PM    IGG3 Test Not Performed 05/02/2021 04:26 PM    IGG3 34.2 05/02/2021 04:26 PM    IGG4 Test Not Performed 05/02/2021 04:26 PM    IGG4 178.0 (H) 05/02/2021 04:26 PM     IgA:   Lab Results   Component Value Date/Time    IGA 318.2 05/02/2021 04:26 PM     IgM: No results found for: IGM  IgE:   Lab Results   Component Value Date/Time    IGE 484 (H) 05/02/2021 04:26 PM     HIV: No results found for: HIV  CBC-D:   Lab Results   Component Value Date/Time    WBC 12.4 (H) 05/02/2021 04:26 PM    HGB 14.7 05/02/2021 04:26 PM    HCT 44.6 (H) 05/02/2021 04:26 PM    PLT 598 (H) 05/02/2021 04:26 PM    NEUTROABS 10.5 (H) 05/02/2021 04:26 PM    LYMPHSABS 1.6 05/02/2021 04:26 PM    EOSABS 0.0 05/02/2021 04:26 PM    BASOSABS 0.1 05/02/2021 04:26 PM     #obesity  #OSA  - previous sleep study done at [facility] 2020 recommend lifestyle modification vs CPAP, never started on CPAP and need titration study  - sleep well, does not snore, not c/w OSA  - doesn't know if she snore or apneic episodes at night  - uses stationary bike for exercise 2x a week    #Memory problems, speech problems  - college educated  - MRI in March     Past Medical History:  Past Medical History:   Diagnosis Date    Asthma     Colon polyps 03/04/2010    hyperplastic    History of anxiety     Hypertension      Past Surgical History:   Procedure Laterality Date    none      PR COLONOSCOPY FLX DX W/COLLJ SPEC WHEN PFRMD N/A 11/25/2013    Procedure: COLONOSCOPY, FLEXIBLE, PROXIMAL TO SPLENIC FLEXURE; DIAGNOSTIC, W/WO COLLECTION SPECIMEN BY BRUSH OR WASH;  Surgeon: Dewaine Conger, MD;  Location: [facility] [place];  Service: Gastroenterology    TUBAL LIGATION         Other History:  The social history and family history were personally reviewed and updated in the patient's electronic medical record.    Social history  - non-smoker, used to smoke as a teenager (1 year at most)  - no alcohol  - no drug use    Home Medications:  Current Outpatient Medications on File Prior to Visit   Medication Sig Dispense Refill    albuterol 2.5 mg /3 mL (0.083 %) nebulizer solution Inhale 3 mL (2.5 mg total) by nebulization every six (6) hours as needed for wheezing. 60 mL 3    atorvastatin (LIPITOR) 40 MG tablet Take 1 tablet (40 mg total) by mouth daily. 90 tablet 3    benzonatate (TESSALON) 100 MG capsule Take 1 capsule (100 mg total) by mouth two (2) times a day as  needed for cough. 20 capsule 0    cetirizine (ZYRTEC) 10 MG tablet Take 1 tablet (10 mg total) by mouth daily. 30 tablet 2    dupilumab 300 mg/2 mL PnIj Inject the contents of 1 pen (300 mg total) under the skin every fourteen (14) days. 4 mL 3    empty container Misc Use as directed to dispose of Dupixent pens 1 each 2    EPINEPHrine (EPIPEN) 0.3 mg/0.3 mL injection Inject 0.3 mL (0.3 mg total) into the muscle once for 1 dose for severe allergic reaction. 2 each 0    fluticasone propionate (FLONASE) 50 mcg/actuation nasal spray 1 spray into each nostril daily. 16 g 0    fluticasone-umeclidin-vilanter (TRELEGY ELLIPTA) 200-62.5-25 mcg DsDv Inhale 1 puff  in the morning. 180 each 6    inhalational spacing device (AEROCHAMBER MV) Spcr 1 each by Miscellaneous route in the morning. 1 each 0    inhalational spacing device (AEROCHAMBER MV) Spcr 1 each by Miscellaneous route daily as needed. 1 each 0    loratadine (CLARITIN) 10 mg tablet Take 1 tablet (10 mg total) by mouth daily. 90 tablet 3    losartan-hydroCHLOROthiazide (HYZAAR) 100-25 mg per tablet Take 1 tablet by mouth daily. 30 tablet 0    montelukast (SINGULAIR) 10 mg tablet Take 1 tablet (10 mg total) by mouth daily. 90 tablet 3    omeprazole (PRILOSEC) 20 MG capsule Take 1 capsule (20 mg total) by mouth daily.      [EXPIRED] predniSONE (DELTASONE) 20 MG tablet Take 1 tablet (20 mg total) by mouth daily for 5 days. 5 tablet 0    [DISCONTINUED] amLODIPine (NORVASC) 5 MG tablet Take 1 tablet (5 mg total) by mouth daily. 30 tablet 0     Current Facility-Administered Medications on File Prior to Visit   Medication Dose Route Frequency Provider Last Rate Last Admin    albuterol (ACCUNEB) nebulizer solution 0.63 mg  0.63 mg Nebulization Once Regino Schultze, Alixandra Alfieri, MD        albuterol nebulizer solution 2.5 mg  2.5 mg Nebulization Once Halyard, Laurian Brim, MD           Allergies:  Allergies as of 11/28/2021 - Reviewed 11/07/2021   Allergen Reaction Noted    Hydralazine-reserpin-hcthiazid Rash 06/21/2015    Shellfish containing products Rash 05/06/2017       Review of Systems:  A comprehensive review of systems was completed and negative except as noted in HPI.    PHYSICAL EXAM:     There were no vitals filed for this visit.      General: Alert and oriented, no acute distress  HEENT: MMM, clear oropharynx, sinus erythema  CV: RRR, no m/r/g  Lungs: mild expiratory wheezing  Abd: Soft, NT, ND, no rebound or guarding  Ext: Warm, well perfused, no peripheral edema  Skin: No rashes  Neuro: No focal deficits    LABORATORY and RADIOLOGY DATA:     Pulmonary Function Tests/Interpretation:  11/2019 PFT   - severe obstruction w/ bronchodilator response  - unreliable lung volumes and DLCO    05/2021  No desaturation    Pertinent Laboratory Data:  - no baseline labs    Pertinent Imaging Data:    05/2021 CT chest  1. Relative to the comparison CT scan the chest from February 07, 2021, this study demonstrates slight improvement in the diffuse centrilobular micronodularity involving all lobes of both lungs. As before, these changes are associated with diffuse bronchial wall thickening and mild  bronchiectasis. The findings are suggestive of chronic infection.       2. A moderate burden of calcified atherosclerotic disease is evident within the coronary arterial distribution.     01/2021 CXR  The lungs are clear with no pleural fluid and a normal cardiomediastinal contour. Significant scoliosis..      10/2020 CT chest  Bronchiectasis, bronchiolectasis, and tree-in-bud opacities. Differential considerations include recurrent aspiration and indolent indolent or resolved infection. ABPA is a less likely consideration due to absence of varicose bronchi        05/2020 CTA chest   1. No pulmonary embolism as clinically questioned.  2. Bronchial wall thickening and mild bronchiectasis involving the bilateral lower lobes with scattered mucous/debris filled airways likely represents sequelae of bronchitis. Recommend follow-up CT in 3 months to ensure resolution.    02/2020 TTE   1. The left ventricle is normal in size with mildly increased wall  thickness.    2. The left ventricular systolic function is hyperdynamic, LVEF is visually  estimated at >70%.    3. There is grade I diastolic dysfunction (impaired relaxation).    4. The right ventricle is normal in size, with normal systolic function.    5. Pulmonary systolic pressure cannot be estimated due to insufficient TR  jet.     09/2019 CT chest  Diffuse bronchial wall thickening and occasional mucus impaction compatible with known asthma.  No acute infectious process    06/2019 MRI brain at [facility]:   Normal appearance of the brain itself.     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     Mucosal inflammatory changes of the ethmoid and sphenoid sinuses. No   advanced finding. This could conceivably relate headache. The   patient has had some chronic inflammation in this region over the   last several scans however, and today's findings are not as advanced   as on some of the previous studies. pressure cannot be estimated due to insufficient TR  jet.     09/2019 CT chest  Diffuse bronchial wall thickening and occasional mucus impaction compatible with known asthma.  No acute infectious process    06/2019 MRI brain at [facility]:   Normal appearance of the brain itself.     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     Mucosal inflammatory changes of the ethmoid and sphenoid sinuses. No   advanced finding. This could conceivably relate headache. The   patient has had some chronic inflammation in this region over the   last several scans however, and today's findings are not as advanced   as on some of the previous studies.

## 2021-11-28 ENCOUNTER — Ambulatory Visit: Admit: 2021-11-28 | Discharge: 2021-11-29 | Payer: MEDICARE

## 2021-11-28 DIAGNOSIS — J4541 Moderate persistent asthma with (acute) exacerbation: Principal | ICD-10-CM

## 2021-11-28 DIAGNOSIS — J4550 Severe persistent asthma, uncomplicated: Principal | ICD-10-CM

## 2021-11-28 DIAGNOSIS — K219 Gastro-esophageal reflux disease without esophagitis: Principal | ICD-10-CM

## 2021-11-28 DIAGNOSIS — F32A Depression, unspecified depression type: Principal | ICD-10-CM

## 2021-11-28 MED ORDER — TRELEGY ELLIPTA 200 MCG-62.5 MCG-25 MCG POWDER FOR INHALATION
Freq: Every day | RESPIRATORY_TRACT | 3 refills | 0 days | Status: CP
Start: 2021-11-28 — End: ?

## 2021-11-28 MED ORDER — OMEPRAZOLE 20 MG CAPSULE,DELAYED RELEASE
ORAL_CAPSULE | Freq: Every day | ORAL | 3 refills | 90 days | Status: CP
Start: 2021-11-28 — End: ?

## 2021-11-28 MED ORDER — MONTELUKAST 10 MG TABLET
ORAL_TABLET | Freq: Every day | ORAL | 3 refills | 90 days | Status: CP
Start: 2021-11-28 — End: 2022-11-23

## 2021-11-28 NOTE — Unmapped (Addendum)
---------------------------------------------------------------------------------------------------------------  Please take your medications as prescribed.  Thank you for allowing me to be a part of your care.  Please call the clinic with any questions. Please use your nebulized duonebs every 6 hours as needed if you are having worsening breathing symptoms and contact me. We would like to prevent another asthma exacerbation.    Sleep Study Scheduling: Please call [phone] to schedule your sleep study  -------------------------------------------------------------------------------------------------------------------------------------------------------------    Rosary Lively, MD  Pulmonary and Critical Care Medicine  [address]  [id number]  [place], [place] [address]    Between appointments, you can reach [place] at these numbers:    For appointments or the Pulmonary Nurse: [phone]  Fax: [phone]    For urgent issues after hours:  Hospital Operator: [phone], ask for Pulmonary Fellow on call    Some general information about contacting us:  - We will do our best to respond as quickly as possible, as your message is important to [place]. However, many of our providers have other duties (inpatient hospital work, Producer, television/film/video activities, teaching) that may make them unavailable for same day responses.   - For phone calls or MyChart messages, it may take 2-3 business days for you to hear back from our clinic. If your doctor is working in the hospital when a message is sent, a response from your doctor may be delayed. We apologize in advance for any delays.  - MyChart should not be used for issues that require same day response.  - If you have an urgent issue that you feel needs a response the same day, you should also contact your primary care provider or be evaluated at an Urgent Care clinic.  - If you have sent a MyChart message to the clinic and have not received a response after 2-3 business days, please call us at [phone] to speak to a nurse.      Airway Clearance    What is airway clearance?  Airway clearance is how we overcome the impaired mucociliary clearance that is present in bronchiectasis, regardless of the cause.  When mucus sits in your airways, it causes inflammation and possibly further damage to your lungs.  Mucus also holds bacteria within your airways, leading to more inflammation and recurrent exacerbations.  Because of this, it is critical to improve your airway clearance get the mucus out of your airways.    What are the options for airway clearance?  Exercise: 20 to 30 minutes of aerobic activity of your choice.  This includes Zumba classes, jumping on a trampoline, swimming, etc.  Pros: Can be inexpensive and exercise has other benefits like improving bone density, mood, and overall health  Cons: May not feel like exercising when you are sick or when the weather is bad.  If you haven???t been exercising, it might be hard to know where to start.  In this case, ask me if pulmonary rehab might be right for you.  Nebulizing hypertonic saline: Using a nebulizer to inhale sodium chloride 3% or higher concentrations helps pull water into the dried mucus in the airways, making it easier to dislodge it and cough it out.  It is also a little irritating to the airways so it can make your cough more forceful.  Pros: Treatments typically take 10 minutes and are very easy to do.  Lots of patients find it very effective.  Cons: Some insurance companies don???t cover sodium chloride. Some people may need to use albuterol before or with the hypertonic saline  because it can make airways twitchy and narrow.  You need a nebulizer and to clean the nebulizer cups.  Hand-held devices: This includes the Acapella, the flutter valve, the Pari-PEP, and the Brazil.  The Acapella and the flutter valve do not use a nebulizer.  The Pari-PEP and the Brazil can be used with or without a nebulizer.  The Acapella-Duet is only used as part of a nebulizer treatment.  You steadily blow out through the device, which produces a flutter sound and wave.  This flutter wave is conducted down your airways to help shake and shear the mucus off your airway walls.  Pros: Small and portable.  Pretty easy to get these devices (often through a durable medical equipment [DME] company).  Cons: Cleaning is still necessary.  Insurance coverage of each device varies.  Some devices are not carried by DME companies.  Percussion vest: There are two main companies who make these devices - [employer] and [employer].  They are typically worn for 20 to 30 minutes per day, and provide pressure waves around your chest.  These waves are transmitted to your airways to help shake and shear the mucus off your airway walls.  Pros: Can be worn while nebulizing.  Effort independent, meaning that the vest will provide these vibrations even when you are sick or even asleep.  Minimal cleaning  Cons: Expensive. Bulky so difficult to take on trips.  Postural drainage: With postural drainage, you get into a position that helps drain fluid out of the lung.  Because the airways of the lung branch out different directions, mucus drainage from the upper portions happens because of gravity.  Mucus doesn???t drain well from the lower lobes because it would have to move up-hill.  Pros: Can be done as part of a yoga or Pilates class.  Inexpensive.  Cons: Do not perform right after a meal or if you have GI issues like delayed gastric emptying, gastroparesis, or severe GERD (reflux).   Chest physiotherapy: Either you or someone else claps a hand or a cup-device over your ribs and back, spending 5 to 7 minutes on each area of the chest.  This helps shake the mucus off the airway walls and is often done in conjunction with postural drainage.  Pros: Inexpensive. If done by someone else, method is effort-independent.  Cons: Can be hard to reach your back if you are doing it.  Need a caregiver who is around to perform therapy and is willing to dedicate time to doing it properly.    What method is the best method?  The best method is the one that works for you.  If you find it effective, you are more likely to do it.  If it fits into your life, you are more likely to do it.  We don???t have studies that show that one device or method is better than another method or device.  As you can see, there are pros and cons to each method.  You may find that you want to use one device when you are well and another when you are sick.  You may want to use several devices at the same time, like nebulizing hypertonic saline through an Brazil or performing postural drainage while wearing the vest.    What is the best order of treatments?  There is no perfect order, but you want to try and use a routine that you can complete twice daily. Here is a suggested order:  1. Bronchodilators (Albuterol/Xopenex/Ipratropium)  2. Hypertonic saline  3. Airway clearance device (Acapella, Aerobika)  4. Inhaled medications (Advair, Symbicort, Spiriva, etc)    Does this mean I???ll cough up more sputum?  Not necessarily.  We notice if we cough up big globs of sputum but if the sputum is broken in to small pieces, they are swept up the airway and swallowed without [place] paying much attention.  Some patients find that when they regularly do airway clearance, they don???t cough up much sputum.  If the amount of sputum doesn???t decrease, you are still getting benefit from doing the therapy.  Remember that for all of the mucus you???ve seen coming up from your lungs, there is more in there that hasn???t come up, at least not until we help it come up.    So then, how do I know the method of airway clearance is working for me?  This where I ask for you to trust that it works and be patient when looking for benefit.  The measure of how well airway clearance is working is not sputum production.  Rather, it is improvement in lung function and a decrease in exacerbation number and severity.  These outcomes take time to be seen.     How often do I need to do airway clearance?  I recommend performing clearance at least twice a day: in the morning and in the evening.  When you perform clearance in the morning, you are moving out the mucus and secretions that have settled in your airways overnight so you can start the day off right.  When you perform clearance in the evening, you are clearing out the irritants, particles, and mucus that you inhaled during the day, allowing for you to get more restful sleep.    When you are sick with an exacerbation, or an acute worsening of your lung disease, you have more mucus that needs to be cleared out of your airways.  You should increase your airway clearance frequency to make up for this.  Even if you are on antibiotics, it is still very important to perform your airway clearance.  By moving the mucus out of your lungs, the environment is not as friendly to the bacteria, making it harder for them to stick around.    How do I clean my equipment and how often?  Nebulizer cups and hand-held airway clearance devices need to be cleaned with warm soapy sterile water after each use.  Additionally, the cups and hand-held devices should be sterilized daily.  Options for sterilization may vary by device but typically include boiling for 5 minutes, soaking in rubbing alcohol (70% isopropyl alcohol) for 5 minutes and rinse with sterile water, or using a baby bottle sterilizer.  I do NOT recommend using vinegar to clean your equipment because many of my patients have bacteria that are not killed using vinegar.  To prevent bacteria from growing on your equipment (and then inhaling the bacteria), it is important to clean and sterilize your equipment daily.  Nebulizer cups and the tubing should be replaced by the DME company every 6 months.  Do not wash or boiling your nebulizer tubing.    How do I get an Acapella or an Brazil or a vest?  The vest is carried by the two companies I mentioned above; it is not available through a DME company.  I complete a form and submit it to the company.  A representative should contact you regarding delivery.  They or I will need to  know the measurement around the fullest part of your chest and the largest circumference of your abdomen (stomach) while sitting to ensure that the vest fits you.    Some DME companies carry the Acapella or the PariPEP or the flutter valve.  The Acapella is also available through [employer].  You want the green colored one, which is the high-flow device.  The Brazil device has been more difficult to obtain from DME companies.  It is available for purchase at http://www.aerobika-therapy.com/order-[email or web address]    If I???m admitted to the hospital, what will I do for airway clearance?   [facility] no longer provides manual chest physiotherapy during hospitalization.  Hypertonic saline, Acapella devices, and now, Aerobika devices are available for airway clearance during hospitalizations.  Because the Acapella and Waymon Budge are single use devices, they are yours to take home when you are discharged.  There are vests available as well but they tend to be older models.  Therefore, if you LOVE your vest for airway clearance, bring it with you to the hospital.    Based on my experience with different devices and techniques, I have found that certain patients do well with certain devices.  I also have received lots of feedback from patients about their experiences with the devices.  I even tried out the two major percussion vests while at a conference.  All of this information helps guide my suggestions for airway clearance options for you, my patient.  I do not currently and have not previously received any gift or reward for getting patients to use a certain device.  My suggestions and advice are based on my experience and my patients??? experiences with the devices.

## 2021-12-07 NOTE — Unmapped (Signed)
[facility] Refill Coordination Note    Specialty Medication(s) to be Shipped:   CF/Pulmonary/Asthma: Dupixent 300mg /53ml    Other medication(s) to be shipped: No additional medications requested for fill at this time     Regina Vargas, DOB: 18-Sep-1950  Phone: [phone] (home)       All above HIPAA information was verified with patient's family member, daughter.     Was a Nurse, learning disability used for this call? No    Completed refill call assessment today to schedule patient's medication shipment from the [facility] [phone]).  All relevant notes have been reviewed.     Specialty medication(s) and dose(s) confirmed: Regimen is correct and unchanged.   Changes to medications: Alixandra reports no changes at this time.  Changes to insurance: No  New side effects reported not previously addressed with a pharmacist or physician: None reported  Questions for the pharmacist: No    Confirmed patient received a Conservation officer, historic buildings and a Surveyor, mining with first shipment. The patient will receive a drug information handout for each medication shipped and additional FDA Medication Guides as required.       DISEASE/MEDICATION-SPECIFIC INFORMATION        For patients on injectable medications: Patient currently has ? doses left.  Next injection is scheduled for 12/15/21.    SPECIALTY MEDICATION ADHERENCE     Medication Adherence    Patient reported X missed doses in the last month: 0  Specialty Medication: DUPIXENT PEN 300 mg/2 mL Pnij (dupilumab)  Patient is on additional specialty medications: No  Patient is on more than two specialty medications: No  Demonstrates understanding of importance of adherence: yes  Informant: child/children  Reliability of informant: reliable  Provider-estimated medication adherence level: good  Patient is at risk for Non-Adherence: No  Reasons for non-adherence: no problems identified                                Were doses missed due to medication being on hold? No    DUPIXENT PEN 300 mg/2 mL Pnij (dupilumab)  : 0?? days of medicine on hand       REFERRAL TO PHARMACIST     Referral to the pharmacist: Not needed      [facility] [place]     Shipping address confirmed in Epic.     Delivery Scheduled: Yes, Expected medication delivery date: 12/13/21.     Medication will be delivered via UPS to the prescription address in Epic WAM.    Inesha Sow' W Wilhemena Durie Shared [facility] Specialty Technician

## 2021-12-12 MED FILL — DUPIXENT 300 MG/2 ML SUBCUTANEOUS PEN INJECTOR: SUBCUTANEOUS | 28 days supply | Qty: 4 | Fill #3

## 2021-12-16 NOTE — Unmapped (Signed)
I saw and evaluated the patient, participating in the key portions of the service.  I reviewed the resident???s note.  I agree with the resident???s findings and plan. Azelie Noguera E Melquisedec Journey, MD

## 2021-12-31 DIAGNOSIS — J4551 Severe persistent asthma with (acute) exacerbation: Principal | ICD-10-CM

## 2021-12-31 MED ORDER — DUPIXENT 300 MG/2 ML SUBCUTANEOUS PEN INJECTOR
SUBCUTANEOUS | 3 refills | 28 days | Status: CP
Start: 2021-12-31 — End: ?
  Filled 2022-01-21: qty 4, 28d supply, fill #0

## 2022-01-07 ENCOUNTER — Ambulatory Visit: Admit: 2022-01-07 | Discharge: 2022-01-07 | Payer: MEDICARE

## 2022-01-07 DIAGNOSIS — G4733 Obstructive sleep apnea (adult) (pediatric): Principal | ICD-10-CM

## 2022-01-07 DIAGNOSIS — F0390 Unspecified dementia without behavioral disturbance: Principal | ICD-10-CM

## 2022-01-07 NOTE — Unmapped (Signed)
[facility]   [address]  [place], [place] [address]  COGNITIVE-BEHAVIORAL NEUROLOGY      Date: January 07, 2022   Patient Name: Regina Vargas   MRN: [id number]   PCP: Judie Petit  Referring Provider: Karl Ito*       Assessment/Plan:   1. Dementia without behavioral disturbance (CMS-HCC)  8 year history of gradual cognitive decline, with first mention of memory loss in 2016. Cognitive issues were repeatedly attributed to sleep apnea, mood disorder and underlying chronic health conditions. At this time, it is clear that she has a neurodegenerative disease in addition to the preceding. 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 I will obtain both a skin biopsy for alpha-synuclein and csf for beta-amyloid and tau.  - Needs psychometric testing rescheduled as she didn't arrive in time for that appointment today  - Lumbar Puncture; Future  - Alzheimer Disease Evaluation, CSF; Future  - Beta-Amyloid Ratio (1-42/1-40), Spinal Fluid  - Referral Laboratory Test, Other; Future    2. OSA (obstructive sleep apnea)  Previously diagnosed, not on treatment. Needs CPAP titration, as previously ordered by Pulmonary physician but apparently never scheduled.   - Polysomnography (Standard); Future    3. Depression/frequent crying  Dr. Jeanice Lim: please consider an SSRI such as sertraline or escitalopram    We did not discuss the Cognitive Disorders Patient Registry  Return for schedule for LP and skin biopsy same day. Needs f/u 5+ weeks after the day they are done..     Subjective   I had the pleasure of seeing Regina Vargas, a 71 y.o. yo female, in neurologic consultation at the request of Dr. Marilu Favre, Tommy Medal*  in the company of dtr. Rene Kocher, who was essential to providing a complete HPI.    Cognitive: I forget what I'm going to say and what I have said. Sometimes has trouble understanding conversations in addition though mostly just forgets fast.  Dtr notes that memory loss is getting worse. This year, trouble with cellphone for the first time. She waits for directions and asks permission or what to do next.   Occasionally not sure of her whereabouts.   Neuropsychiatric: notes that tears just come without her intending them to. She also admits to hallucinations of animals though sometimes can't identify them at all. Can tell they're not real. Can happen in the daytime as well as at night.  Sensory-Motor: She feels weak when she walks or sits. She notes that when she gets up to walk, her steps are short. Denies falls.   Denies loss of smell. No trouble with manual dexterity.   IADLS: no longer able to fill out forms. Gets picked up to go shopping and dtr needs to read package labels to her sometimes; hasn't driven in almost 2 years due to increasing confusion with driving.   Cold prep for meals, llives with sister who makes hot meals. Able to use appliances but slowly. Gets confused with the TV remote.   Sleep: takes an hour to fall asleep but then sleeps well. Wakes up early (before dawn), takes unscheduled naps during the day. May wake up confused; not sure where she is.   Bladder/bowel: no incontinence.   FH: no parents or grandparents with dementia. Sister with normal cognition. No PD either.  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 Verdie Mosher whose plan was:   -Recommended that she schedule an appointment with her sleep specialist to order a CPAP machine  - We will have the prior brain imaging sent to the clinic for me to review. Ms. Fiedor signed the release to allow this.  - Blood work was ordered to be done the next time she has blood drawn by a [facility] provider.   PCP note from 11/07/2017: Mild cognitive impairment in a patient with obstructive sleep apnea not on CPAP, concern for pseudodementia of depression: Patient states she has been having memory loss that started last December of year 2016. Patient noticed she was forgetting simple task such leaving the oven on or not closing the door when leaving the house. She denies repeating words or episodes of anyone telling her that she was repeating herself. She has no problem remembering name, familiar places or addresses. She does state that she sometimes have difficulties with word finding. Denies history of head trauma or stroke like symptoms in the past. Mother had a history of unknown memory loss per patient. Denies starting any new medication or recent infectious process. Patient also states she has recently noticed a change in her vision, she is having troubling seeing distance object. No dizziness, syncope episode, slurring of speech or balance problems. Patient has history of Asthma , Obstructive Sleep Apnea and HTN. She has had sleep study done in the past but did not follow through with treatment due to financial reasons. Patient states she has been told that she is snoring, feels like she does not get enough sleep at night. She wakes up feeling tired in the morning.  MMSE 08/01/15: 25/30  Polysomnography 06/08/2015: Sameen had a sleep study on 10/29/2014 at [facility] which revealed moderate obstructive sleep apnea with an overall AHI of 16.4.      Histories: all reviewed and amended as needed within Epic; not reiterated here        Objective    BP Readings from Last 3 Encounters:   01/07/22 158/87   11/28/21 132/80   11/07/21 146/75     Pulse Readings from Last 3 Encounters:   01/07/22 72   11/28/21 75   11/07/21 85      Wt Readings from Last 3 Encounters:   01/07/22 84.9 kg (187 lb 3 oz)   11/28/21 87.3 kg (192 lb 6.4 oz)   06/27/21 87.5 kg (193 lb)       Neurocognitive testing:    Alert, cooperative, attentive. Oriented to person, place, not day/date  Affect broad.   Speech fluent, no dysarthria  Language grammatical, logical, appropriate.  Intact to naming, repetition and comprehension.   Remote recall and general fund of knowledge judged to be intact  Insight judged to be impaired  Praxis judged to be partially intact to finger construtions (self-corrects; not accurate on the first try)  [place]- learns quickly but has trouble maintaining set  Results of further assessment are documented separately as part of this encounter.    Cranial nerves:   Visual fields full.  Extraocular movements intact with good smooth pursuit.   Vertical and horizontal saccades intact.   Pupils equal and reactive to light.   Facial expression and sensation: intact  Hearing: grossly intact bilaterally  Shoulder shrug equal  Tongue and palate movements:  within normal limits.    No tongue atrophy or fasciculations.    Motor:  Involuntary movements absent.  Motor tone: WNL   Normal strength in the upper and lower extremities.   Finger-tapping performed well bilaterally.  Coordination:   RAM upper extremities: intact bilaterally   Toe-tapping lower extremities: intact bilaterally  Finger-nose-finger testing: intact bilaterally  Sensory:   Temperature sense: grossly intact in the distal extremities.   Vibratory sense:  grossly intact in the distal extremities.   Romberg sign: absent.    Reflexes:   2+ and symmetrical throughout.    Babinski sign absent bilaterally.   No grasp reflexes.   Station/Gait:   Arises from chair without using arms.   Narrow base, steady.   Casual gait: good arm swing, stride length, and stability on turning.   Tandem gait ok.    Laboratory testing:  TSH   Date Value Ref Range Status   12/07/2020 1.631 0.550 - 4.780 uIU/mL Final     Vitamin B-12   Date Value Ref Range Status   12/07/2020 707 211 - 911 pg/ml Final     RPR   Date Value Ref Range Status   12/07/2020 Nonreactive Nonreactive Final     Ferritin   Date Value Ref Range Status   02/12/2021 148.2 7.3 - 270.7 ng/mL Final       Imaging:  Results for orders placed or performed during the hospital encounter of 04/04/21   MRI brain with and without contrast    Narrative    EXAM: Magnetic resonance imaging, brain, without and with contrast material.  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  TECHNIQUE: Multiplanar, multisequence MR imaging of the brain was performed without and with I.V. contrast.    FINDINGS:    There are scattered and confluent  foci of signal abnormality within the periventricular and deep white matter.  These are nonspecific but commonly seen with small vessel ischemic changes. Ventricles are normal in size. There is no midline shift. No extra-axial fluid collection. No evidence of intracranial hemorrhage. No diffusion weighted signal abnormality to suggest acute infarct.    In the left anterior margin of the sella, there is a relatively hypoenhancing lesion measuring approximately 0.7 x 0.7 x 0.6 cm (24:113) probably represents a pituitary microadenoma, new since prior.      Impression    Chronic microvascular ischemic changes.  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

## 2022-01-08 NOTE — Unmapped (Signed)
Please call your primary care to schedule an appointment for evaluation for an antidepressant for crying

## 2022-01-09 NOTE — Unmapped (Signed)
[facility] Refill Coordination Note    Specialty Medication(s) to be Shipped:   CF/Pulmonary/Asthma: Dupixent 300mg /55ml    Other medication(s) to be shipped: No additional medications requested for fill at this time     Regina Vargas, DOB: December 21, 1950  Phone: [phone] (home)       All above HIPAA information was verified with patient's caregiver, Regina Vargas      Was a translator used for this call? No    Completed refill call assessment today to schedule patient's medication shipment from the [facility] [phone]).  All relevant notes have been reviewed.     Specialty medication(s) and dose(s) confirmed: Regimen is correct and unchanged.   Changes to medications: Blu reports no changes at this time.  Changes to insurance: No  New side effects reported not previously addressed with a pharmacist or physician: None reported  Questions for the pharmacist: No    Confirmed patient received a Conservation officer, historic buildings and a Surveyor, mining with first shipment. The patient will receive a drug information handout for each medication shipped and additional FDA Medication Guides as required.       DISEASE/MEDICATION-SPECIFIC INFORMATION        For patients on injectable medications: Patient currently has 1 doses left.  Next injection is scheduled for 01/09/22.    SPECIALTY MEDICATION ADHERENCE     Medication Adherence    Patient reported X missed doses in the last month: 0  Specialty Medication: DUPIXENT PEN 300 mg/2 mL Pnij (dupilumab)  Patient is on additional specialty medications: No  Patient is on more than two specialty medications: No  Any gaps in refill history greater than 2 weeks in the last 3 months: no  Demonstrates understanding of importance of adherence: yes  Informant: child/children  Reliability of informant: reliable  Provider-estimated medication adherence level: good  Patient is at risk for Non-Adherence: No  Reasons for non-adherence: no problems identified                                Were doses missed due to medication being on hold? No    DUPIXENT PEN 300 mg/2 mL Pnij (dupilumab)  : 0 days of medicine on hand       REFERRAL TO PHARMACIST     Referral to the pharmacist: Not needed      [facility]     Shipping address confirmed in Epic.     Delivery Scheduled: Yes, Expected medication delivery date: 01/22/22.     Medication will be delivered via UPS to the prescription address in Epic WAM.    Terrion Gencarelli' W Wilhemena Durie Shared [facility] Specialty Technician

## 2022-01-10 NOTE — Unmapped (Signed)
[employer] [facility] request form completed and faxed with notes. Scanned with fax confirmation into Media.

## 2022-01-11 NOTE — Unmapped (Signed)
Pt's daughter called with questions about LP and skin biopsy.    Returned call.  Daughter asked for information on what the LP and skin biopsy entails.  Answered questions about procedure and confirmed appt for LP.  I explained that the skin biopsy referral was sent out and that it can take up to a month to get the approval to move forward with scheduling.

## 2022-01-21 ENCOUNTER — Ambulatory Visit: Admit: 2022-01-21 | Discharge: 2022-01-22 | Payer: MEDICARE

## 2022-01-21 MED ORDER — LOSARTAN 100 MG-HYDROCHLOROTHIAZIDE 25 MG TABLET
ORAL_TABLET | Freq: Every day | ORAL | 0 refills | 30 days | Status: CP
Start: 2022-01-21 — End: 2022-02-20

## 2022-01-21 MED ORDER — ESCITALOPRAM 10 MG TABLET
ORAL_TABLET | Freq: Every day | ORAL | 3 refills | 90 days | Status: CP
Start: 2022-01-21 — End: 2023-01-21

## 2022-01-21 NOTE — Unmapped (Signed)
Incoming fax received from [facility] [employer].     Benefits Verification Status.     The patient's coverage is 100% with their primary insurance. Therefore, the patient has an estimated $0 out of pocket cost.     The patient is ready to be scheduled.

## 2022-01-21 NOTE — Unmapped (Signed)
Immediately after or during the visit, I reviewed with the resident the medical history and the resident???s findings on physical examination.  I discussed with the resident the patient???s diagnosis and concur with the treatment plan as documented in the resident note. Nestor Ramp, MD

## 2022-01-21 NOTE — Unmapped (Signed)
[facility]  Established Patient Clinic Note    Assessment/Plan:   Regina Vargas is a 71 y.o.female    Dementia  History of gradual cognitive decline since 2016. Recently evaluated by neurology earlier this month who feels symptoms most likely attributable to a neurodegenerative disease, has follow up with them and is currently undergoing further workup up. Significant deficits in attention, concentration. MOCA 16 in 2020 at [facility] clinic, 10 when repeated with prior PCP in spring of 2023. MRI in January 2023 showed chronic microvascular ischemic changes and pituitary adenoma which per chart review she has followed with [facility] end[facility]inology for with reassuring initial lab workup. Family pursuing placement for memory care at [facility] which I think is reasonable. Currently she's been living with her sister during the week which she strongly dislikes and with her daughter on the weekend which she enjoys, daughter present with patient today during interview. MRI    Depression  PHQ9 18 today, patient expresses significant sadness that she cannot live alone and that she cannot do the things that she used to enjoy, specifically caring for babies and her family members. Provided supportive listening. She is open to starting an antidepressant  -start escitalopram 10 mg daily  -follow up one month    PHQ-9 PHQ-9 TOTAL SCORE   01/21/2022   9:00 AM 18   01/07/2022   3:00 PM 11   11/07/2021   2:00 PM 13   02/01/2020   3:00 PM 1   07/29/2019   1:00 PM 10       Hypertension  Uncontrolled today, did not have time to address today but will at next follow up in 4-6 weeks.    Screening for Tb  -PPD placed today, follow up in two days for read. Completing FL2 form today, will have this scanned to mychart, per family request can also print this out at follow up nurse visit for PPD read.        Attending: Dr. Hurman Horn    Subjective   Regina Vargas is a 71 y.o. female  coming to clinic today for the following issues:    Chief Complaint   Patient presents with    Referral Setup    PPD Placement     HPI:    see assesment and plan for additional hpi details      I have reviewed the problem list, medications, and allergies and have updated/reconciled them if needed.    Regina Vargas  reports that she has quit smoking. Her smoking use included cigarettes. She has never used smokeless tobacco.  Health Maintenance   Topic Date Due    Medicare Annual Wellness Visit (AWV)  Never done    Zoster Vaccines (2 of 2) 10/22/2019    COVID-19 Vaccine (3 - Moderna risk series) 12/02/2019    DTaP/Tdap/Td Vaccines (2 - Td or Tdap) 02/23/2020    Urine Albumin/Creatinine Ratio  08/26/2020    Retinal Eye Exam  09/02/2020    Influenza Vaccine (1) 11/02/2021    Foot Exam  12/07/2021    Hemoglobin A1c  12/13/2021    Serum Creatinine Monitoring  11/15/2022    Potassium Monitoring  11/15/2022    Mammogram Start Age 56  01/20/2023    Colon Cancer Screening  11/26/2023    DEXA Scan  04/12/2024    COPD Spirometry  11/14/2024    Pneumococcal Vaccine 65+  Completed    Hepatitis C Screen  Completed       Objective  VITALS: Temp 36.2 ??C (97.2 ??F) (Temporal)  - Ht 170.2 cm (5' 7)  - Wt 84.6 kg (186 lb 6.4 oz)  - LMP  (LMP Unknown)  - BMI 29.19 kg/m??     EXAM  GEN: well-appearing  HEENT: Sclera anicteric, no conjunctival injection. No nasal discharge. MMM  RESP: Breathing comfortably on room air.  CV: Extremities warm, well perfused.   EXT: Nml bulk and tone.  SKIN: Non-jaundiced, no rash on examined skin surfaces  NEURO: Moving all extremities symmetrically. Strength grossly nml. Nml gait.   PSYCH: A&Ox4, euthymic w/ congruent affect. Speech linear and appropriate.       LABS/IMAGING  I have reviewed pertinent recent labs and imaging in Epic    [facility]  [place] [facility]  [address], [place], [place] [address]  Telephone [phone]  Fax [phone]  [email or web address]

## 2022-01-23 ENCOUNTER — Institutional Professional Consult (permissible substitution): Admit: 2022-01-23 | Discharge: 2022-01-24 | Payer: MEDICARE

## 2022-01-23 NOTE — Unmapped (Signed)
PPD reading negative on 11/22. Paper work provided for patient.

## 2022-02-08 NOTE — Unmapped (Signed)
Sent via MyChart

## 2022-02-08 NOTE — Unmapped (Signed)
Regina Vargas daughter of patient asked that as soon as the form [facility]) is completed that it is sent through mychart to her and faxed to [phone] Attn: [employer] county department of social service   And also faxed to [employer] Attn: Aura Camps fax # [phone]

## 2022-02-12 NOTE — Unmapped (Signed)
[facility] Refill Coordination Note    Specialty Medication(s) to be Shipped:   CF/Pulmonary/Asthma: Dupixent 300mg /13ml    Other medication(s) to be shipped: No additional medications requested for fill at this time     Regina Vargas, DOB: [birth year]  Phone: [phone] (home)       All above HIPAA information was verified with patient's family member, Regina Vargas.     Was a Nurse, learning disability used for this call? No    Completed refill call assessment today to schedule patient's medication shipment from the [facility] [phone]).  All relevant notes have been reviewed.     Specialty medication(s) and dose(s) confirmed: Regimen is correct and unchanged.   Changes to medications: Regina Vargas reports no changes at this time.  Changes to insurance: No  New side effects reported not previously addressed with a pharmacist or physician: None reported  Questions for the pharmacist: No    Confirmed patient received a Conservation officer, historic buildings and a Surveyor, mining with first shipment. The patient will receive a drug information handout for each medication shipped and additional FDA Medication Guides as required.       DISEASE/MEDICATION-SPECIFIC INFORMATION        For patients on injectable medications: Patient currently has 1 doses left.  Next injection is scheduled for 02/12/22.    SPECIALTY MEDICATION ADHERENCE     Medication Adherence    Patient reported X missed doses in the last month: 0  Specialty Medication: DUPIXENT PEN 300 mg/2 mL Pnij (dupilumab)  Patient is on additional specialty medications: No  Patient is on more than two specialty medications: No  Any gaps in refill history greater than 2 weeks in the last 3 months: no  Demonstrates understanding of importance of adherence: yes  Informant: child/children  Reliability of informant: reliable  Provider-estimated medication adherence level: good  Patient is at risk for Non-Adherence: No  Reasons for non-adherence: no problems identified                                Were doses missed due to medication being on hold? No    DUPIXENT PEN 300 mg/2 mL Pnij (dupilumab) :   0 days of medicine on hand       REFERRAL TO PHARMACIST     Referral to the pharmacist: Not needed      [facility]     Shipping address confirmed in Epic.     Delivery Scheduled: Yes, Expected medication delivery date: 02/19/22.     Medication will be delivered via UPS to the prescription address in Epic WAM.    Regina Vargas' W Wilhemena Durie Shared [facility] Specialty Technician

## 2022-02-18 MED FILL — DUPIXENT 300 MG/2 ML SUBCUTANEOUS PEN INJECTOR: SUBCUTANEOUS | 28 days supply | Qty: 4 | Fill #1

## 2022-02-28 NOTE — Unmapped (Signed)
RN called patient to complete LP Screening for upcoming LP scheduled for 1/10 @ 1030.  No answer.  RN left HIPAA compliant VM and requested a callback.

## 2022-03-01 NOTE — Unmapped (Signed)
LUMBAR PUNCTURE  PRE PROCEDURE   NURSE SCREEN        Pre-procedural planning     Patient Name: Regina Vargas  Patient MRN: [id number]    Indications: Dementia    LP screening completed with patient's daughter, Para March, who will be driving patient to the procedure.Marland Kitchen     BMI 29.19    Medication Reconciliation completed.       Does the patient have an allergy to Lidocaine or Chlorhexadine?  No      Have any CSF lab orders been placed by ordering physician?  yes      IF ANY OF THESE QUESTIONS ARE ANSWERED YES, SEND A MESSAGE TO REFERRING PROVIDER FOR ADVICE    Is INR higher than 1.4?  UTA  Are PLT less than 100,000?  UTA    Is the patient experiencing:  Bleeding- no  Petechiae- no  Bloody stool- no  Dark stool- no      Is the patient taking any of these medications?  No    Aspirin [let the provider know, but most of the time it is not a contraindication]  Clopidogrel [PLAVIX]  Prasugrel [Effient]  Ticragelor [Brillinta]    Apixaban [Eliquis]  Dabigatran [Pradaxa]  Edoxaban [Lixiana]  Rivaroxaban [Xarelto]  Warfarin [Coumadin]    Enoxaparin [Lovenox]  Heparin        Has the patient had lumbar spine surgery in the past, and has fusions or implanted hardware been placed or does the patient suffer from scoliosis?  Per patient's daughter, patient was diagnosed with scoliosis.  Per imaging dated 12/24/07, it looks like thoracic scoliosis.        Does patient verbalize understanding of the process of a lumbar puncture?  yes    IF PATIENT STILL HAS MORE QUESTIONS, SEND MESSAGE TO REFERRING PROVIDER.

## 2022-03-08 DIAGNOSIS — F0390 Unspecified dementia without behavioral disturbance: Principal | ICD-10-CM

## 2022-03-11 NOTE — Unmapped (Signed)
RN spoke to patient's daughter, Para March, and advised her to call [facility] to get the fluoroscopy lumbar puncture schedule.  RN provider radiology contact number [phone] to schedule the procedure. Para March agreed to call radiology.

## 2022-03-11 NOTE — Unmapped (Signed)
RN called to provide patient's daughter Para March with the number to radiology , [phone] schedule the FL LP.  No answer.  RN left HIPAA compliant VM.

## 2022-03-13 NOTE — Unmapped (Signed)
RN reached out to patient's daughter to schedule FL LP for patient.  Radiology was on the line, but was unable to reach a tech with fluoroscopy to get the FL LP scheduled.  Radiology scheduling told patient's daughter they would try again tomorrow.      Patient's daughter called RN requesting information about the skin biopsy scheduled in the clinic.  RN updated her about the skin biopsy. Will update provider about the FL LP.

## 2022-03-19 ENCOUNTER — Ambulatory Visit: Admit: 2022-03-19 | Payer: MEDICARE

## 2022-03-19 NOTE — Unmapped (Addendum)
Called pt's daughter to follow up on changing LP to Fluoro LP.  Daughter stated that the radiology schedulers had to get radiology tech on the phone to schedule and they had already made several attempts.  I stated I would reach out to Radiology and follow up with her     Called daughter again and stated that pt already has LP Fluoro appt on 1/24.   RN also confirmed skin biopsy appt with daughter.

## 2022-03-20 NOTE — Unmapped (Signed)
Regina Vargas has been contacted in regards to their refill of DUPIXENT PEN 300 mg/2 mL Pnij (dupilumab). At this time, they have declined refill due to  daughter Regina Vargas states Regina Vargas is in an assisted living facility and they have their own pharmacy on site. She will no longer need [facility] for refills. . Refill assessment call date has been updated per the patient's request.

## 2022-03-27 ENCOUNTER — Emergency Department
Admit: 2022-03-27 | Discharge: 2022-03-27 | Disposition: A | Discharge status: Home / Self Care | Payer: MEDICARE | Attending: Student in an Organized Health Care Education/Training Program

## 2022-03-27 ENCOUNTER — Ambulatory Visit
Admit: 2022-03-27 | Discharge: 2022-03-27 | Disposition: A | Discharge status: Home / Self Care | Payer: MEDICARE | Attending: Student in an Organized Health Care Education/Training Program

## 2022-03-27 ENCOUNTER — Ambulatory Visit: Admit: 2022-03-27 | Discharge: 2022-03-27 | Payer: MEDICARE

## 2022-03-27 ENCOUNTER — Ambulatory Visit: Admit: 2022-03-27 | Discharge: 2022-03-27 | Payer: MEDICARE | Attending: Adult Health | Primary: Adult Health

## 2022-03-27 DIAGNOSIS — F0390 Unspecified dementia without behavioral disturbance: Principal | ICD-10-CM

## 2022-03-27 DIAGNOSIS — G4452 New daily persistent headache (NDPH): Principal | ICD-10-CM

## 2022-03-27 DIAGNOSIS — Z7689 Persons encountering health services in other specified circumstances: Principal | ICD-10-CM

## 2022-03-27 LAB — CBC W/ AUTO DIFF
BASOPHILS ABSOLUTE COUNT: 0.1 10*9/L (ref 0.0–0.1)
BASOPHILS RELATIVE PERCENT: 1.4 %
EOSINOPHILS ABSOLUTE COUNT: 0.2 10*9/L (ref 0.0–0.5)
EOSINOPHILS RELATIVE PERCENT: 1.8 %
HEMATOCRIT: 39.5 % (ref 34.0–44.0)
HEMOGLOBIN: 14 g/dL (ref 11.3–14.9)
LYMPHOCYTES ABSOLUTE COUNT: 3.1 10*9/L (ref 1.1–3.6)
LYMPHOCYTES RELATIVE PERCENT: 32.8 %
MEAN CORPUSCULAR HEMOGLOBIN CONC: 35.4 g/dL (ref 32.0–36.0)
MEAN CORPUSCULAR HEMOGLOBIN: 30.1 pg (ref 25.9–32.4)
MEAN CORPUSCULAR VOLUME: 85 fL (ref 77.6–95.7)
MEAN PLATELET VOLUME: 8 fL (ref 6.8–10.7)
MONOCYTES ABSOLUTE COUNT: 0.4 10*9/L (ref 0.3–0.8)
MONOCYTES RELATIVE PERCENT: 4 %
NEUTROPHILS ABSOLUTE COUNT: 5.6 10*9/L (ref 1.8–7.8)
NEUTROPHILS RELATIVE PERCENT: 60 %
PLATELET COUNT: 539 10*9/L — ABNORMAL HIGH (ref 150–450)
RED BLOOD CELL COUNT: 4.65 10*12/L (ref 3.95–5.13)
RED CELL DISTRIBUTION WIDTH: 13.5 % (ref 12.2–15.2)
WBC ADJUSTED: 9.4 10*9/L (ref 3.6–11.2)

## 2022-03-27 LAB — MAGNESIUM: MAGNESIUM: 1.7 mg/dL (ref 1.6–2.6)

## 2022-03-27 LAB — URINALYSIS WITH MICROSCOPY WITH CULTURE REFLEX
BACTERIA: NONE SEEN /HPF
BILIRUBIN UA: NEGATIVE
BLOOD UA: NEGATIVE
GLUCOSE UA: NEGATIVE
KETONES UA: NEGATIVE
NITRITE UA: NEGATIVE
PH UA: 7 (ref 5.0–9.0)
PROTEIN UA: NEGATIVE
RBC UA: 1 /HPF (ref <=4)
SPECIFIC GRAVITY UA: 1.011 (ref 1.003–1.030)
SQUAMOUS EPITHELIAL: 6 /HPF — ABNORMAL HIGH (ref 0–5)
UROBILINOGEN UA: 3 — AB
WBC UA: 6 /HPF — ABNORMAL HIGH (ref 0–5)

## 2022-03-27 LAB — COMPREHENSIVE METABOLIC PANEL
ALBUMIN: 3.9 g/dL (ref 3.4–5.0)
ALKALINE PHOSPHATASE: 61 U/L (ref 46–116)
ALT (SGPT): <7 U/L — ABNORMAL LOW (ref 10–49)
ANION GAP: 9 mmol/L (ref 5–14)
AST (SGOT): 19 U/L (ref <=34)
BILIRUBIN TOTAL: 0.6 mg/dL (ref 0.3–1.2)
BLOOD UREA NITROGEN: 12 mg/dL (ref 9–23)
BUN / CREAT RATIO: 17
CALCIUM: 9.8 mg/dL (ref 8.7–10.4)
CHLORIDE: 104 mmol/L (ref 98–107)
CO2: 28 mmol/L (ref 20.0–31.0)
CREATININE: 0.7 mg/dL
EGFR CKD-EPI (2021) FEMALE: >90 mL/min/{1.73_m2} (ref >=60)
GLUCOSE RANDOM: 92 mg/dL (ref 70–179)
POTASSIUM: 3.5 mmol/L (ref 3.4–4.8)
PROTEIN TOTAL: 7.8 g/dL (ref 5.7–8.2)
SODIUM: 141 mmol/L (ref 135–145)

## 2022-03-27 LAB — HIGH SENSITIVITY TROPONIN I - SINGLE: HIGH SENSITIVITY TROPONIN I: <3 ng/L (ref <=34)

## 2022-03-27 NOTE — Unmapped (Signed)
HA x1 month, worsened past 3 days. Also endorsing some dizziness.

## 2022-03-27 NOTE — Unmapped (Signed)
SKIN BIOPSY  Neurology Clinic     [facility]  [address], [address]  [place], [place] [address]  (office) [phone]  (fax)  [phone]  Claris [email or web address]      Date: 03/27/2022  Patient Name: Regina Vargas  MRN: [id number]            Pt arrived not feeling well and declined during intake.  Blurry and double vision  SOB, no CP  New sudden onset headache, worst she has ever had  Hx of COPD/Asthma and recent exacerbation 11/2021.    O2 99, HR 75  BP recheck 134/78  Now living at [facility], they give her meds, didn't miss any today.    EMS was called    Pt and daughter left for hospital.

## 2022-03-27 NOTE — Unmapped (Signed)
[facility]  Emergency Department Provider Note      ED Clinical Impression      Final diagnoses:   New daily persistent headache (Primary)            Impression, Medical Decision Making, Progress Notes and Critical Care      Impression, Differential Diagnosis and Plan of Care    This is an afebrile, nontoxic-appearing 72 year old female presenting for evaluation acutely worsening of a new daily headache that varies from mild to intense with associated blurriness of vision, chest pain, generalized weakness, and decreased appetite over the past month.  On exam patient appears well with no neurological deficits noted, cranial nerves intact with exception of CN I and II which were not examined, negative Romberg, normal S1-S2 with no notable murmurs, clear lung sounds auscultation all fields, 5 out of 5 strength in all extremities.    Differentials include acute worsening of patient's known pituitary adenoma versus migraines versus electrolyte imbalance versus infectious causes versus aneurysm.    Will order CBC, CMP, UA, single troponin, chest x-ray, EKG, and CT head without contrast.  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    Patient's CBC, CMP, magnesium, troponin, and UA nonconcerning.  Patient did have moderate leukocytes and some white blood cells within her urine however she did have 6 squamous epithelium.  Should urine culture grow any bacteria the patient be contacted and advised on course of treatment if necessary.  Patient's chest x-ray was nonconcerning with no acute infiltrates, consolidations, or effusions.  Patient CT head showed no acute intra cranial abnormalities to explain patient's headaches over the past month.    With no acute findings and patient appearing well on exam will discharge with close return precautions and advisement to follow this up with her primary care.    Portions of this record have been created using Scientist, clinical (histocompatibility and immunogenetics). Dictation errors have been sought, but may not have been identified and corrected.    See chart and resident provider documentation for details.    ____________________________________________         History        Reason for Visit  Headache Recurrent or Known Dx Migraines        HPI   Regina Vargas is a 72 y.o. female past medical history as listed below with significance for known pituitary adenoma presenting for evaluation of day the intermittent headaches that vary and pain level with associated blurry vision and no reported neurological deficits.  Patient was at her neurologist earlier this morning where she had acute worsening of her headache with blurred vision and she was sent via EMS to the emergency department for evaluation.      External Records Reviewed  (Inpatient/Outpatient notes, Prior labs/imaging studies, Care Everywhere, PDMP, External ED notes, etc)    Reviewed previous notes, labs, imaging, Care Everywhere.    Past Medical History:   Diagnosis Date    Asthma     Colon polyps 03/04/2010    hyperplastic    History of anxiety     Hypertension        Patient Active Problem List   Diagnosis    Cortical senile cataract    Mixed hyperlipidemia    Presbyopia    Scoliosis    Senile nuclear sclerosis    Diastolic dysfunction    Health maintenance  examination    Essential hypertension    COPD with acute exacerbation (CMS-HCC)    Type 2 diabetes mellitus without complication, without long-term current use of insulin (CMS-HCC)    Dementia without behavioral disturbance (CMS-HCC)    OSA (obstructive sleep apnea)    Pituitary adenoma (CMS-HCC)    Seasonal allergic rhinitis    Vitamin D deficiency    Thrombocytosis    GERD (gastroesophageal reflux disease)    Hyperglycemia    Leukocytosis    Sinus tachycardia    Moderate persistent asthma with acute exacerbation    Severe asthma    Current moderate episode of major depressive disorder (CMS-HCC)       Past Surgical History:   Procedure Laterality Date    none      PR COLONOSCOPY FLX DX W/COLLJ SPEC WHEN PFRMD N/A 11/25/2013    Procedure: COLONOSCOPY, FLEXIBLE, PROXIMAL TO SPLENIC FLEXURE; DIAGNOSTIC, W/WO COLLECTION SPECIMEN BY BRUSH OR WASH;  Surgeon: Dewaine Conger, MD;  Location: [facility];  Service: Gastroenterology    TUBAL LIGATION           Current Facility-Administered Medications:     albuterol (ACCUNEB) nebulizer solution 0.63 mg, 0.63 mg, Nebulization, Once, Regino Schultze, Tai, MD    albuterol nebulizer solution 2.5 mg, 2.5 mg, Nebulization, Once, Halyard, Laurian Brim, MD    Current Outpatient Medications:     albuterol 2.5 mg /3 mL (0.083 %) nebulizer solution, Inhale 3 mL (2.5 mg total) by nebulization every six (6) hours as needed for wheezing., Disp: 60 mL, Rfl: 3    dupilumab (DUPIXENT PEN) 300 mg/2 mL PnIj, Inject the contents of 1 pen (300 mg total) under the skin every fourteen (14) days., Disp: 4 mL, Rfl: 3    empty container Misc, Use as directed to dispose of Dupixent pens, Disp: 1 each, Rfl: 2    EPINEPHrine (EPIPEN) 0.3 mg/0.3 mL injection, Inject 0.3 mL (0.3 mg total) into the muscle once for 1 dose for severe allergic reaction., Disp: 2 each, Rfl: 0    escitalopram oxalate (LEXAPRO) 10 MG tablet, Take 1 tablet (10 mg total) by mouth daily., Disp: 90 tablet, Rfl: 3    fluticasone propionate (FLONASE) 50 mcg/actuation nasal spray, 1 spray into each nostril daily., Disp: 16 g, Rfl: 0    fluticasone-umeclidin-vilanter (TRELEGY ELLIPTA) 200-62.5-25 mcg DsDv, Inhale 1 puff  in the morning., Disp: 180 each, Rfl: 3    inhalational spacing device (AEROCHAMBER MV) Spcr, 1 each by Miscellaneous route in the morning., Disp: 1 each, Rfl: 0    loratadine (CLARITIN) 10 mg tablet, Take 1 tablet (10 mg total) by mouth daily., Disp: 90 tablet, Rfl: 3    losartan-hydroCHLOROthiazide (HYZAAR) 100-25 mg per tablet, Take 1 tablet by mouth daily., Disp: 30 tablet, Rfl: 0    omeprazole (PRILOSEC) 20 MG capsule, Take 1 capsule (20 mg total) by mouth daily., Disp: 90 capsule, Rfl: 3    Allergies  Hydralazine-reserpin-hcthiazid and Shellfish containing products    Family History   Problem Relation Age of Onset    Breast cancer Neg Hx        Social History  Social History     Tobacco Use    Smoking status: Former     Types: Cigarettes    Smokeless tobacco: Never   Vaping Use    Vaping Use: Never used   Substance Use Topics    Alcohol use: No    Drug use: No  Physical Exam     ED Triage Vitals   Enc Vitals Group      BP 03/27/22 1125 130/72      Heart Rate 03/27/22 1113 78      SpO2 Pulse --       Resp 03/27/22 1125 18      Temp 03/27/22 1125 36.7 ??C (98.1 ??F)      Temp Source 03/27/22 1125 Oral      SpO2 03/27/22 1113 99 %      Weight 03/27/22 1125 83.9 kg (185 lb)      Height --       Head Circumference --       Peak Flow --       Pain Score --       Pain Loc --       Pain Edu? --       Excl. in GC? --        Constitutional: Alert and oriented. Well appearing and in no distress.  Eyes: Conjunctivae are normal.  ENT       Head: Normocephalic and atraumatic.       Nose: No congestion.       Mouth/Throat: Mucous membranes are moist.       Neck: No stridor.  Hematological/Lymphatic/Immunilogical: No cervical lymphadenopathy.  Cardiovascular: Normal rate, regular rhythm. Normal and symmetric distal pulses are present in all extremities.  Respiratory: Normal respiratory effort. Breath sounds are normal.  Gastrointestinal: Soft and nontender. There is no CVA tenderness.  Genitourinary: No symptoms HPI, deferred exam.  Musculoskeletal: Normal range of motion in all extremities.       Right lower leg: No tenderness or edema.       Left lower leg: No tenderness or edema.  Neurologic: Normal speech and language. No gross focal neurologic deficits are appreciated.  Cranial nerves intact with exception of CN I and II which were not examined.  Skin: Skin is warm, dry and intact. No rash noted.  Psychiatric: Mood and affect are normal. Speech and behavior are normal.       Radiology     CT Head Wo Contrast   Final Result      No evidence of acute intracranial pathology.      XR Chest 2 views   Final Result      No acute airspace disease.                   Wallene Huh, [place]  03/27/22 [phone]

## 2022-03-27 NOTE — Unmapped (Signed)
BIB [facility] from pcp office for headaches x 1 month that have gotten worse over the last 3 days. Describes as pounding in forehead. Endorses dizziness and nausea. Also having chest weakness. Took aspirin this morning. Denies current numbness/tingling in extremities. Stroke screen neg with ems.

## 2022-03-27 NOTE — Unmapped (Signed)
Specialty Medication(s): Dupixent    Regina Vargas has been dis-enrolled from the [facility] specialty pharmacy services due to a pharmacy change. The patient is now filling at pharmacy at assisted living facility .    Additional information provided to the patient: Daughter, [name], states she believes pharmacy has prescription for Dupixent but will call our pharmacy if prescription transfer is needed.    Oliva Bustard, PharmD  [facility] Specialty Pharmacist

## 2022-03-28 NOTE — Unmapped (Signed)
Pt ambulatory at discharge, pt given hard copy of discharge paperwork, verbalized understanding.

## 2022-04-10 ENCOUNTER — Ambulatory Visit: Admit: 2022-04-10 | Discharge: 2022-04-11 | Payer: MEDICARE | Attending: Adult Health | Primary: Adult Health

## 2022-04-10 DIAGNOSIS — R441 Visual hallucinations: Principal | ICD-10-CM

## 2022-04-10 DIAGNOSIS — F0390 Unspecified dementia without behavioral disturbance: Principal | ICD-10-CM

## 2022-04-11 DIAGNOSIS — I10 Essential (primary) hypertension: Principal | ICD-10-CM

## 2022-04-11 MED ORDER — LOSARTAN 100 MG-HYDROCHLOROTHIAZIDE 25 MG TABLET
ORAL_TABLET | Freq: Every day | ORAL | 0 refills | 0 days
Start: 2022-04-11 — End: ?

## 2022-04-15 MED ORDER — LOSARTAN 100 MG-HYDROCHLOROTHIAZIDE 25 MG TABLET
ORAL_TABLET | Freq: Every day | ORAL | 0 refills | 30 days | Status: CP
Start: 2022-04-15 — End: ?

## 2022-05-06 DIAGNOSIS — I10 Essential (primary) hypertension: Principal | ICD-10-CM

## 2022-05-06 MED ORDER — LOSARTAN 100 MG-HYDROCHLOROTHIAZIDE 25 MG TABLET
ORAL_TABLET | Freq: Every day | ORAL | 0 refills | 90 days | Status: CP
Start: 2022-05-06 — End: ?

## 2022-05-20 ENCOUNTER — Ambulatory Visit: Admit: 2022-05-20 | Discharge: 2022-05-21 | Payer: MEDICARE

## 2022-05-27 ENCOUNTER — Ambulatory Visit: Admit: 2022-05-27 | Discharge: 2022-05-28 | Payer: MEDICARE

## 2022-05-27 DIAGNOSIS — G301 Alzheimer's disease with late onset: Principal | ICD-10-CM

## 2022-05-27 DIAGNOSIS — F02B 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, psychotic disturbance, mood disturbance, or anxiety (CMS-HCC): Principal | ICD-10-CM

## 2022-05-27 MED ORDER — DONEPEZIL 10 MG TABLET
ORAL_TABLET | Freq: Every morning | ORAL | 3 refills | 90 days | Status: CP
Start: 2022-05-27 — End: 2023-05-22

## 2023-02-26 IMAGING — CR DG CHEST 2V
1 series · 2 of 2 positions shown · non-contrast
Comparison: None.

CLINICAL DATA: Shortness of breath

EXAM:
CHEST - 2 VIEW

[Series 1: dg chest 2 view · 0.14mm/px · 2 of 2 slices shown]
[im 1/2]
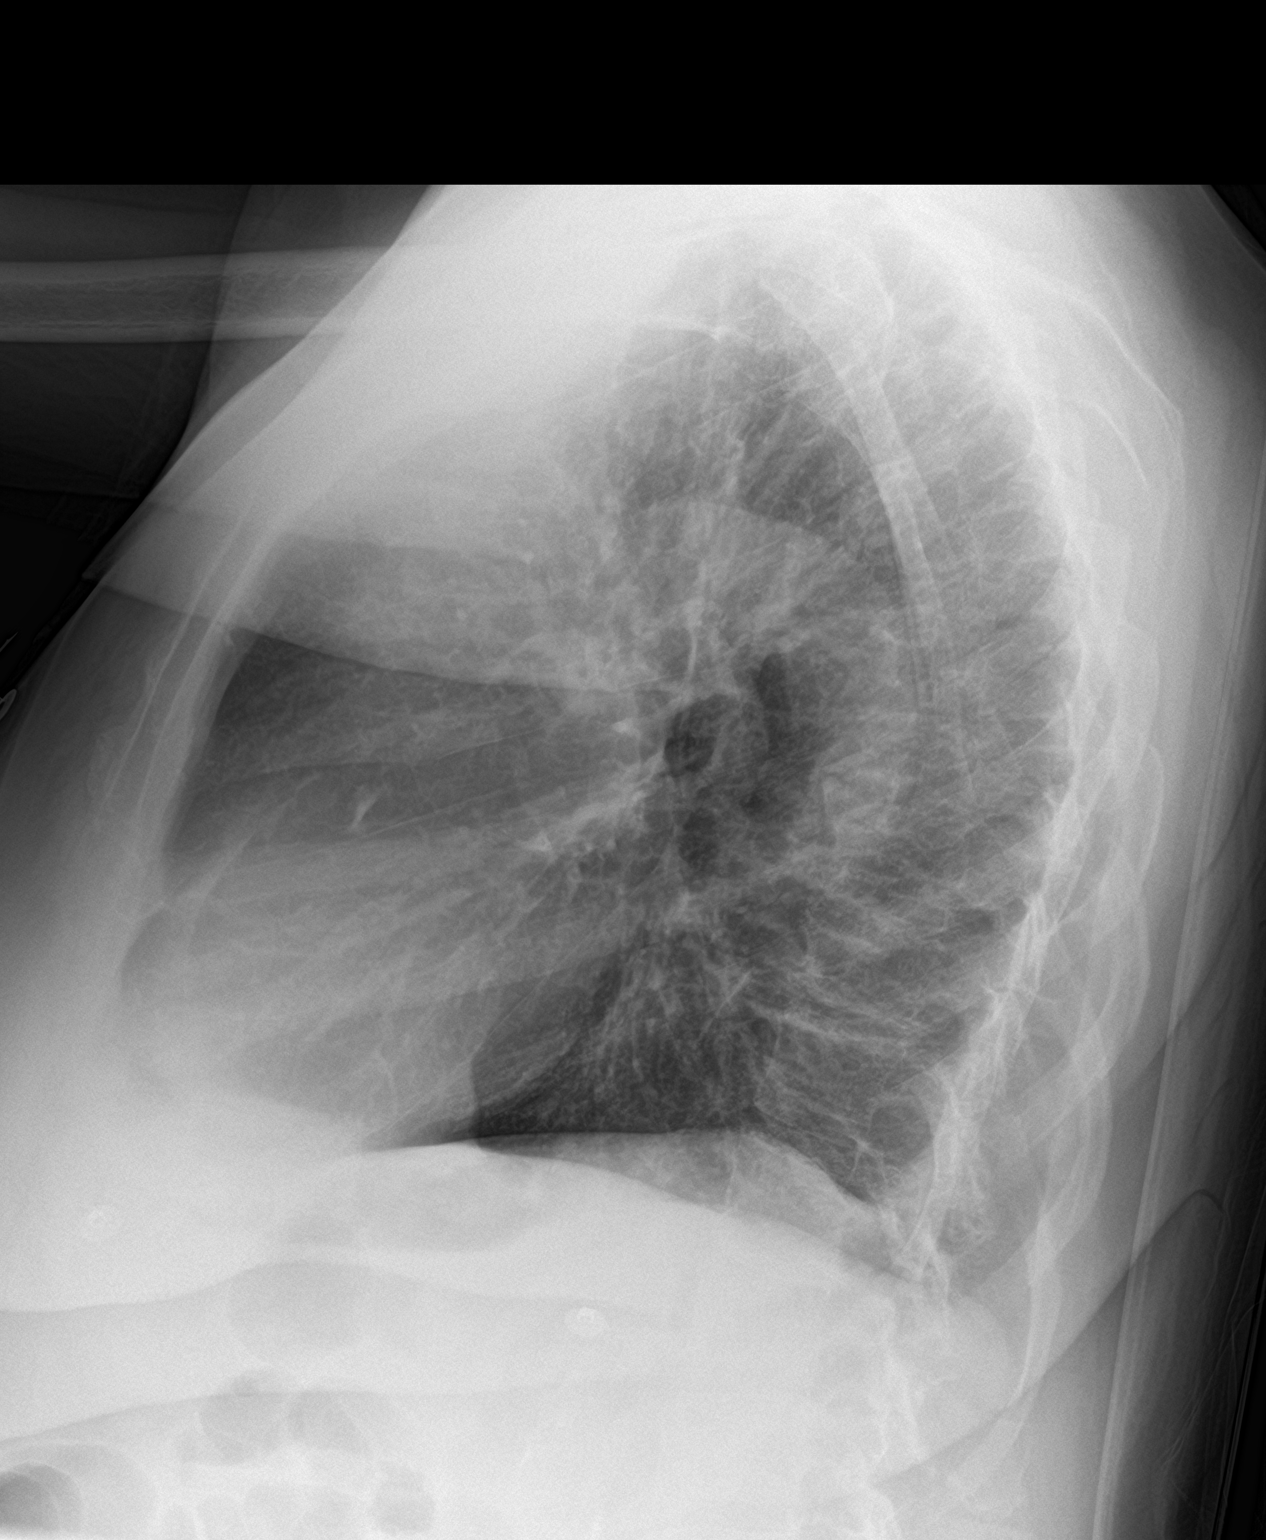
[im 2/2]
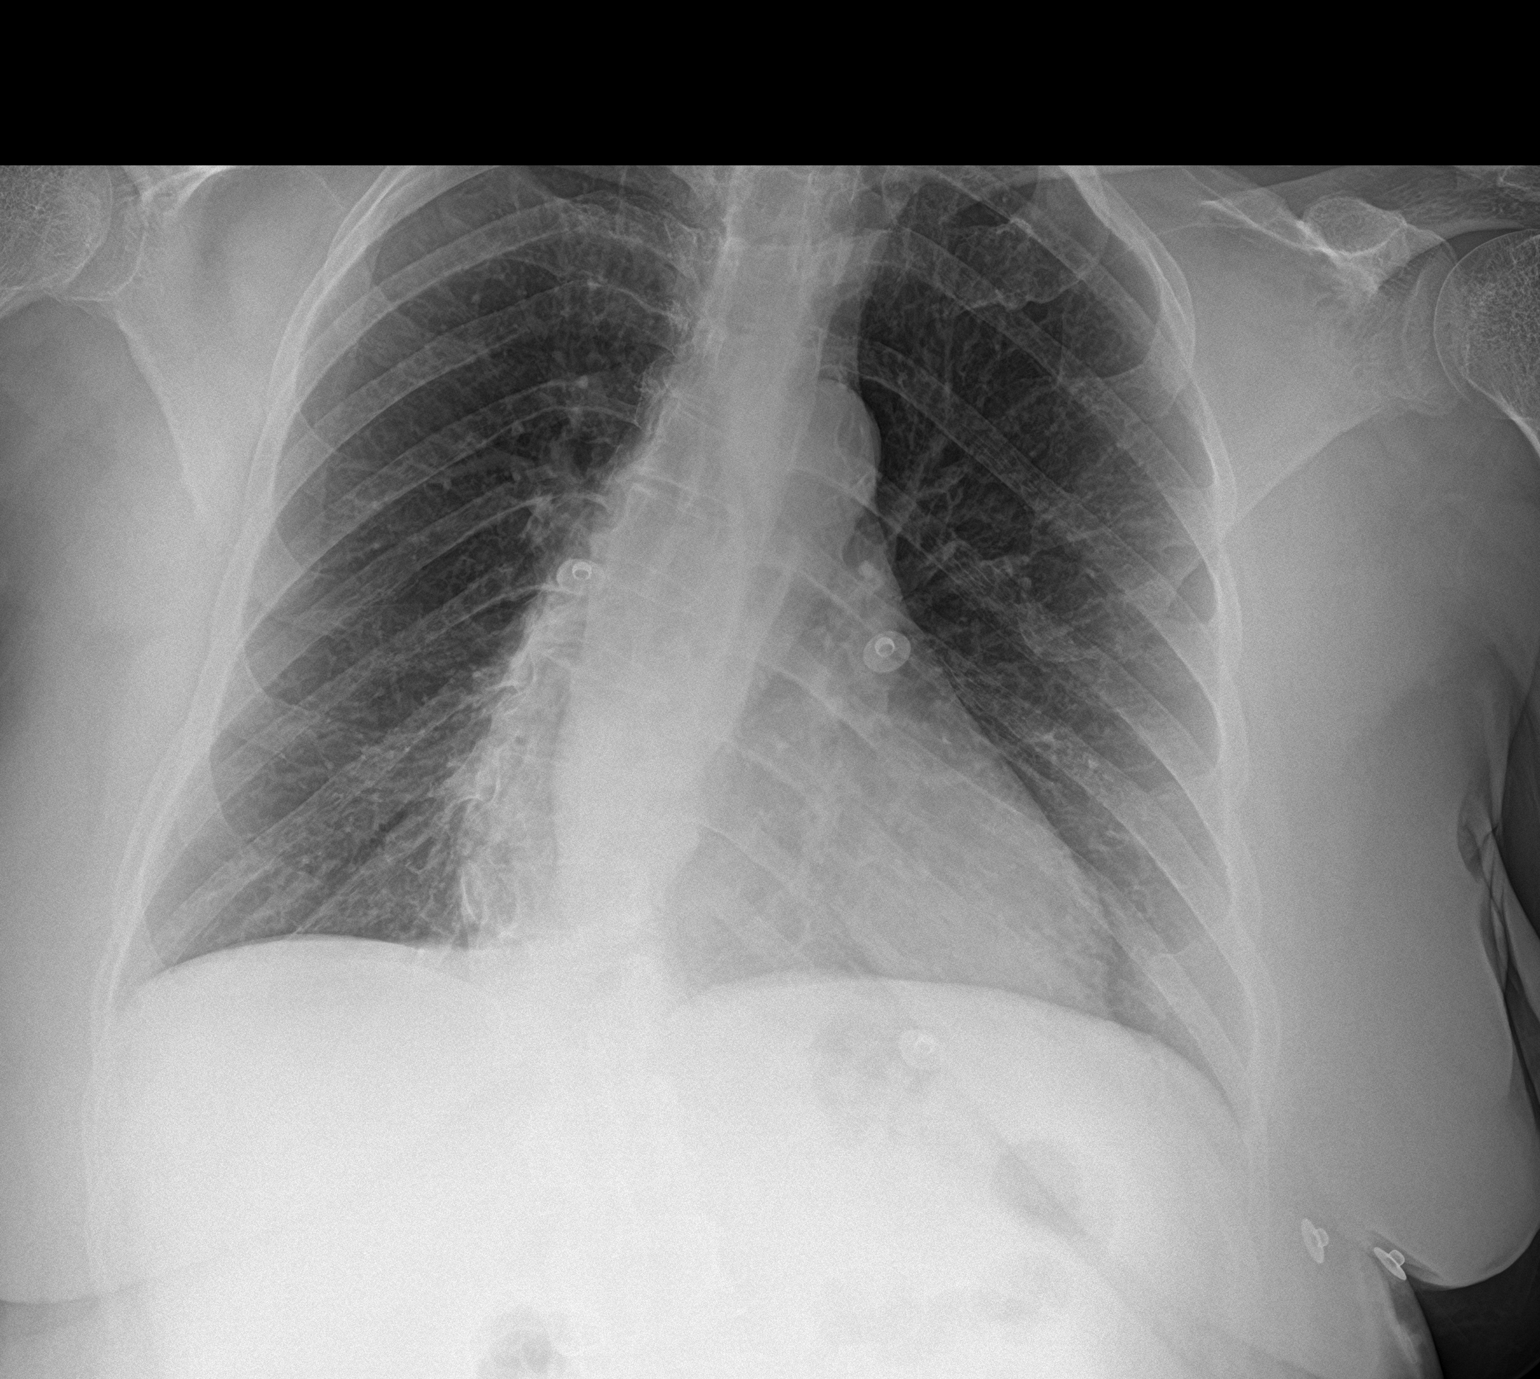

[2 of 2 positions shown; findings below may reference images not displayed]

FINDINGS: The heart size and mediastinal contours are within normal limits.
Both lungs are clear. No pleural effusion or pneumothorax.
Dextroscoliosis lower thoracic spine.
IMPRESSION: No acute process in the chest.

## 2023-03-12 DIAGNOSIS — Z1231 Encounter for screening mammogram for malignant neoplasm of breast: Principal | ICD-10-CM

## 2023-03-31 ENCOUNTER — Inpatient Hospital Stay: Admit: 2023-03-31 | Discharge: 2023-04-01 | Payer: MEDICARE

## 2023-04-28 IMAGING — CR DG CHEST 2V
2 series · 2 of 2 positions shown · non-contrast
Comparison: Prior chest radiographs 02/22/2021.

CLINICAL DATA: Provided history: Shortness of breath. Shortness of
breath and productive cough for over 1 month. Patient reports fevers
at home.

EXAM:
CHEST - 2 VIEW

[chest pa]
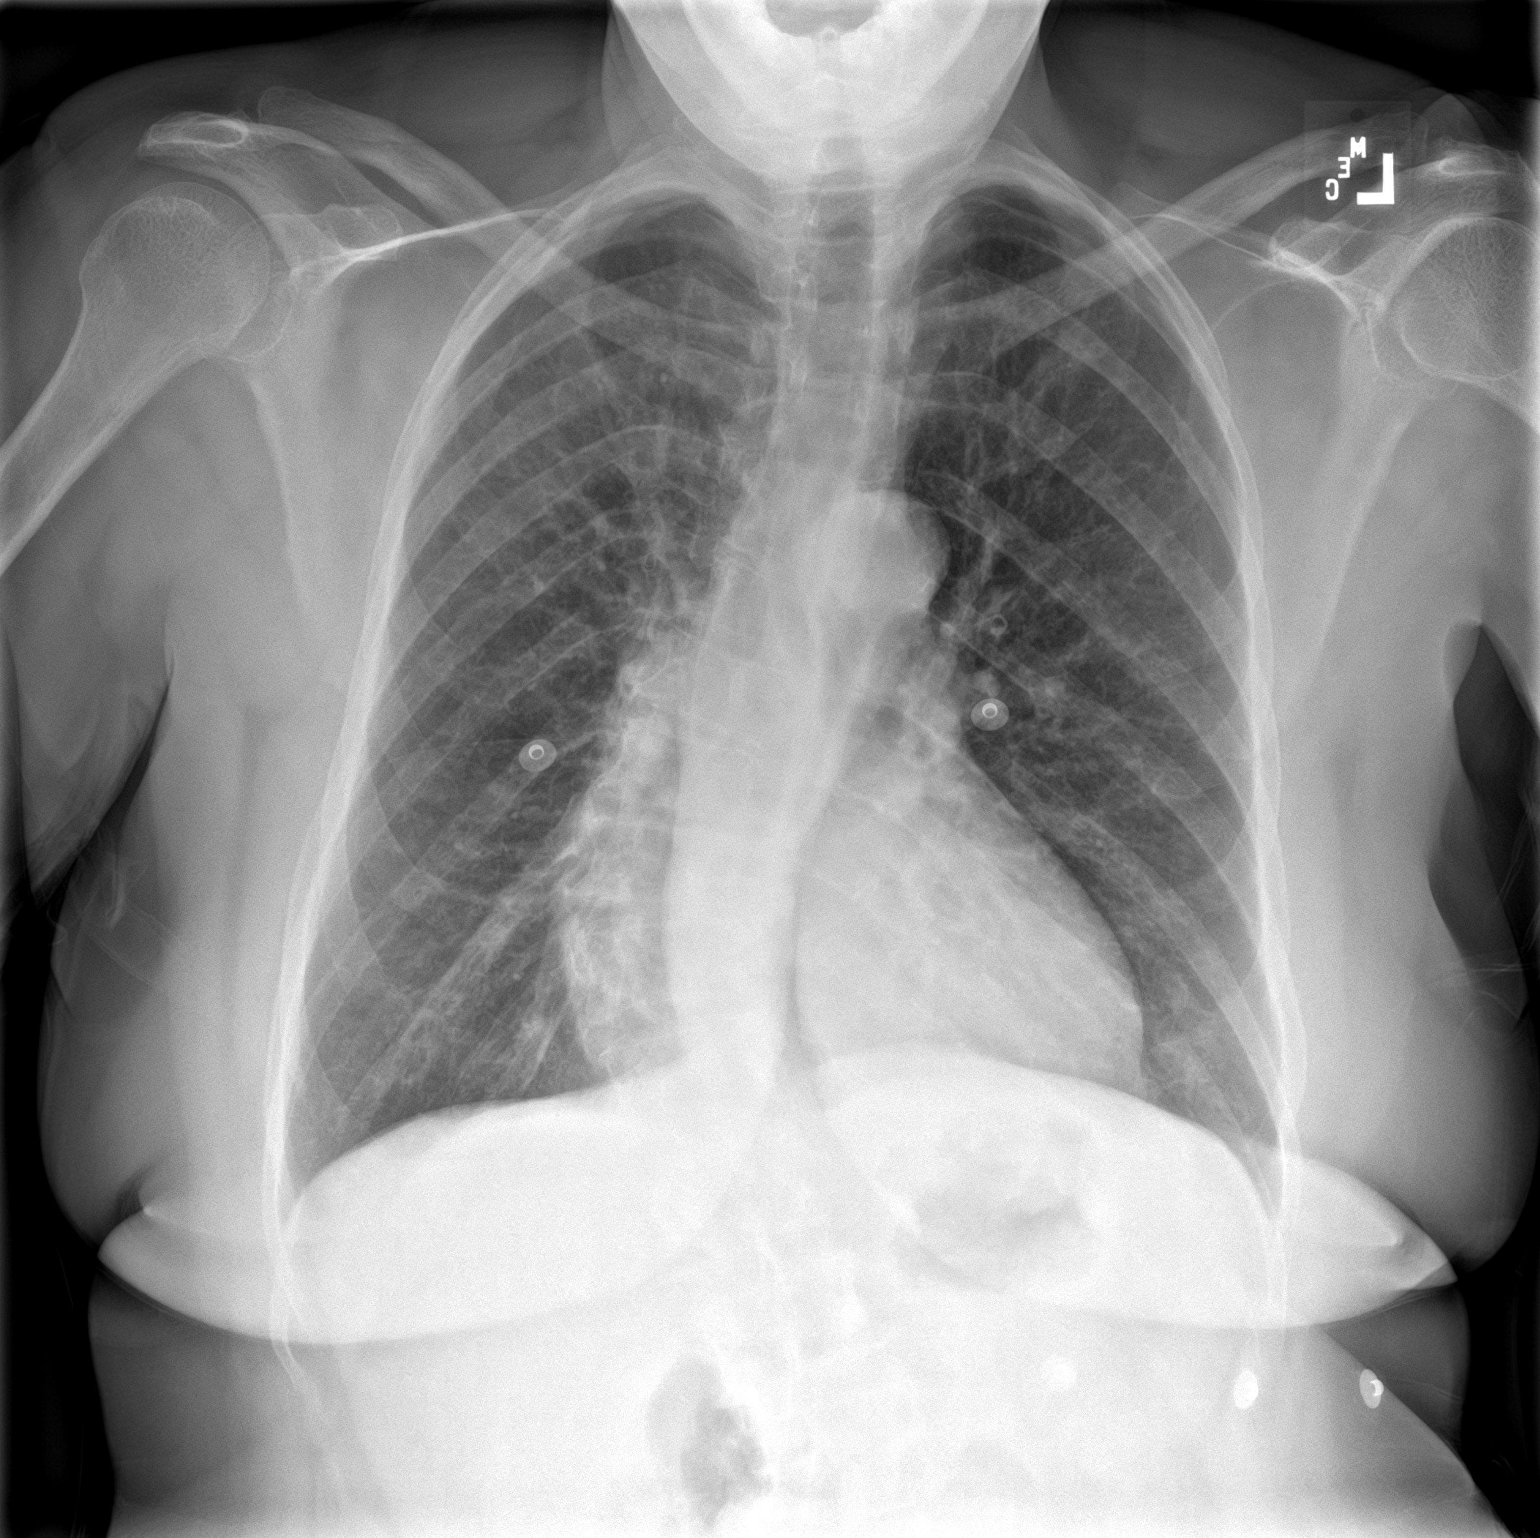

[chest lat]
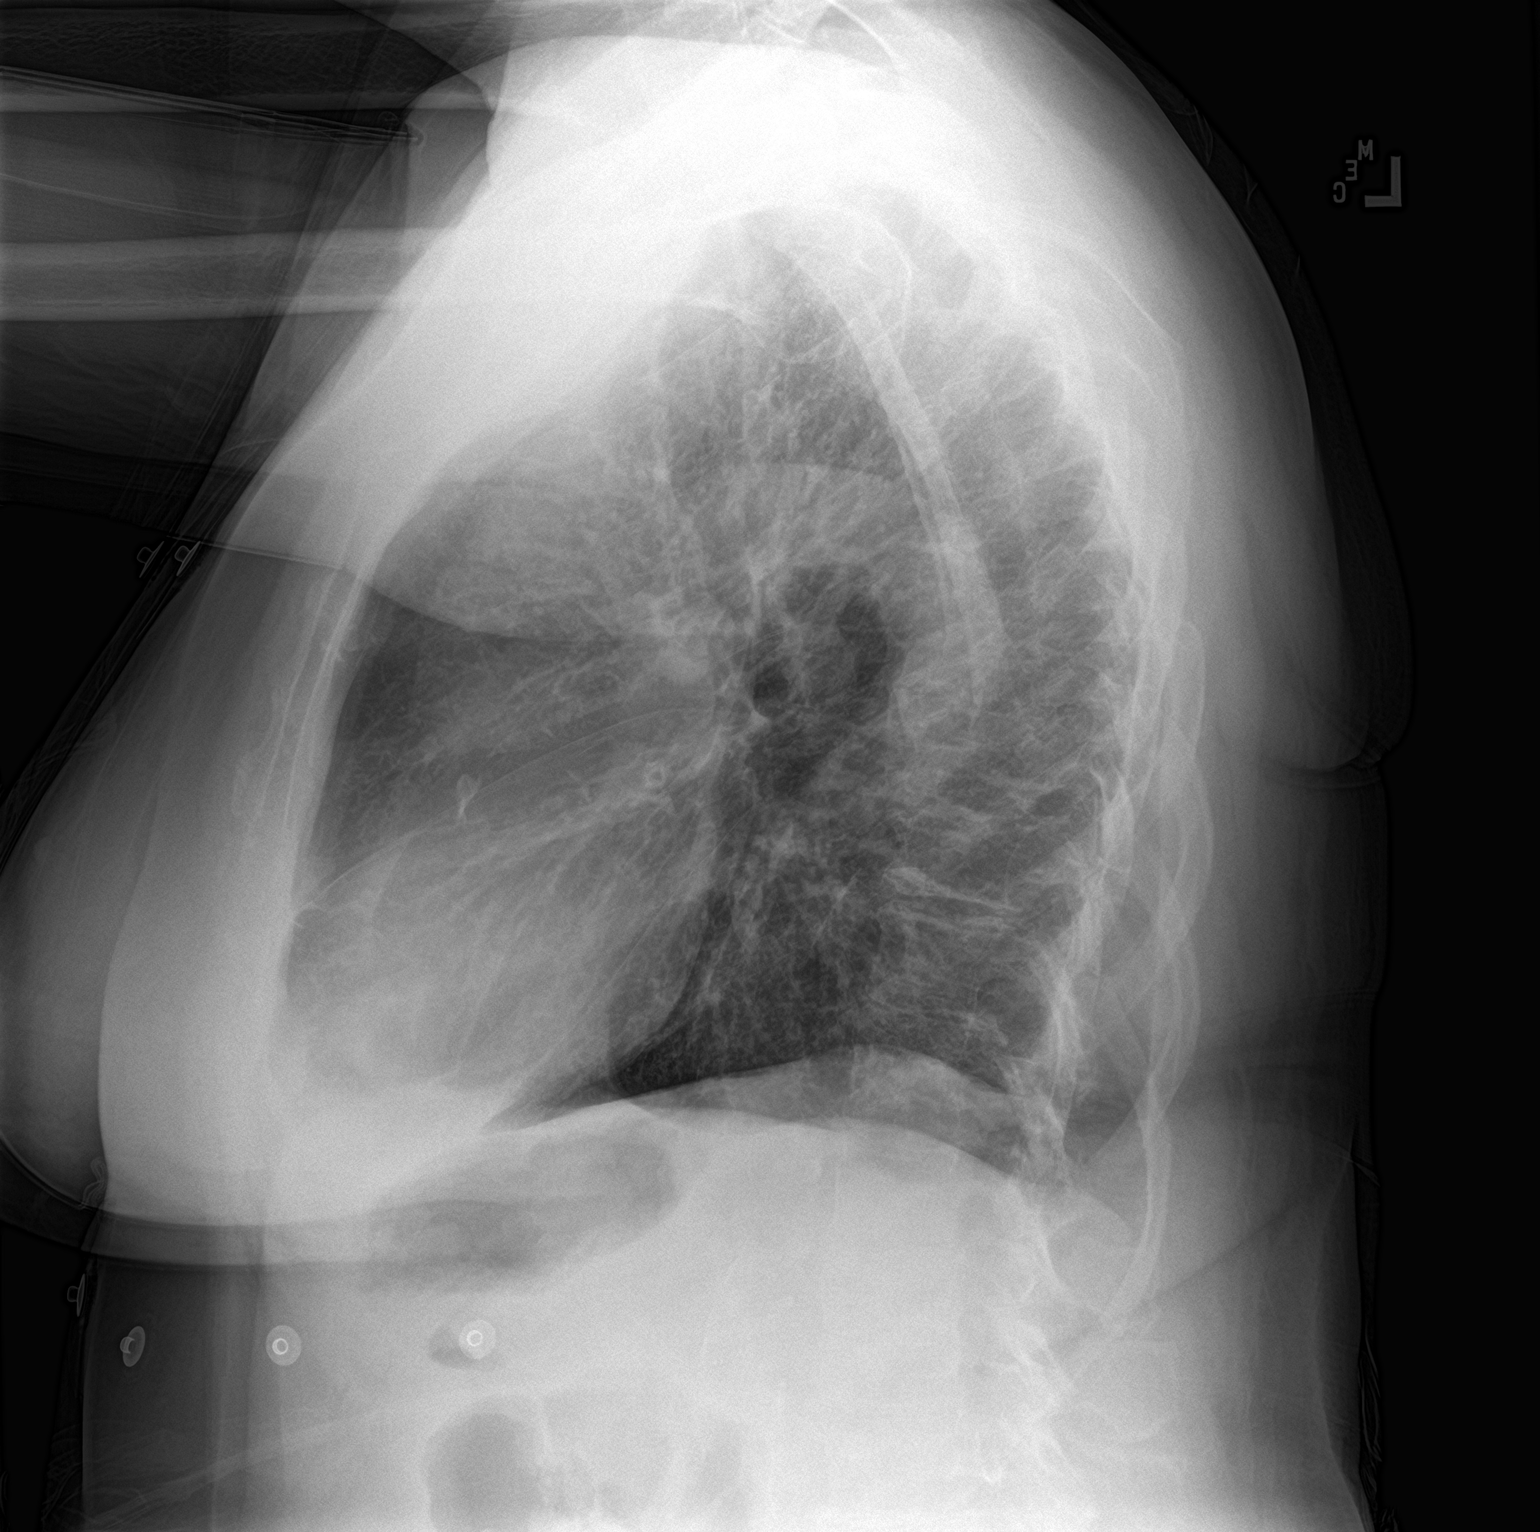

[2 of 2 positions shown; findings below may reference images not displayed]

FINDINGS: Heart size within normal limits. Aortic atherosclerosis. No
appreciable airspace consolidation. No evidence of pleural effusion
or pneumothorax. No acute bony abnormality identified. Prominent
thoracic dextrocurvature.
IMPRESSION: No evidence of acute cardiopulmonary abnormality.

Aortic Atherosclerosis ([id number]).

Prominent thoracic dextrocurvature.

## 2023-05-16 ENCOUNTER — Ambulatory Visit: Admit: 2023-05-16 | Discharge: 2023-05-17 | Payer: MEDICARE

## 2023-05-21 ENCOUNTER — Ambulatory Visit
Admit: 2023-05-21 | Discharge: 2023-05-22 | Payer: MEDICARE | Attending: Student in an Organized Health Care Education/Training Program | Primary: Student in an Organized Health Care Education/Training Program

## 2023-05-21 DIAGNOSIS — G4733 Obstructive sleep apnea (adult) (pediatric): Principal | ICD-10-CM

## 2023-05-21 DIAGNOSIS — J479 Bronchiectasis, uncomplicated: Principal | ICD-10-CM

## 2023-05-21 DIAGNOSIS — J4541 Moderate persistent asthma with (acute) exacerbation: Principal | ICD-10-CM

## 2023-05-21 DIAGNOSIS — J45909 Unspecified asthma, uncomplicated: Principal | ICD-10-CM

## 2023-05-21 DIAGNOSIS — R942 Abnormal results of pulmonary function studies: Principal | ICD-10-CM

## 2023-05-21 MED ORDER — PREDNISONE 20 MG TABLET
ORAL_TABLET | Freq: Every day | ORAL | 0 refills | 5.00 days | Status: CP
Start: 2023-05-21 — End: 2023-05-26

## 2023-05-21 MED ORDER — ALBUTEROL SULFATE 2.5 MG/3 ML (0.083 %) SOLUTION FOR NEBULIZATION
RESPIRATORY_TRACT | 3 refills | 4.00 days | Status: CP | PRN
Start: 2023-05-21 — End: 2023-08-19

## 2023-05-21 MED ORDER — AZITHROMYCIN 250 MG TABLET
ORAL_TABLET | 0 refills | 5.00 days | Status: CP
Start: 2023-05-21 — End: 2023-05-26

## 2023-06-02 DIAGNOSIS — J4551 Severe persistent asthma with (acute) exacerbation: Principal | ICD-10-CM

## 2023-06-02 MED ORDER — DUPIXENT 300 MG/2 ML SUBCUTANEOUS PEN INJECTOR
SUBCUTANEOUS | 5 refills | 28 days | Status: CP
Start: 2023-06-02 — End: ?
  Filled 2023-06-10: qty 4, 28d supply, fill #0

## 2023-06-03 NOTE — Unmapped (Signed)
 [facility] Specialty Medication Onboarding    Specialty Medication: Dupixnet  Prior Authorization: Approved   Financial Assistance: No - copay  <$25  Final Copay/Day Supply: $0 / 28 days    Insurance Restrictions: None     Notes to Pharmacist:   Credit Card on File: not applicable  Start Date on Rx:      The triage team has completed the benefits investigation and has determined that the patient is able to fill this medication at [facility]. Please contact the patient to complete the onboarding or follow up with the prescribing physician as needed.

## 2023-06-04 NOTE — Unmapped (Signed)
 [facility]    Patient Onboarding/Medication Counseling    Regina Vargas is a 73 y.o. female with severe persistent asthma who I am counseling today on continuation of therapy.  I am speaking to the patient's family member, daughter Para March) .    Was a Nurse, learning disability used for this call? No    Verified patient's date of birth / HIPAA.    Specialty medication(s) to be sent: CF/Pulmonary/Asthma: Dupixent      Non-specialty medications/supplies to be sent: n/a      Medications not needed at this time: n/a       The patient declined counseling on medication administration, missed dose instructions, goals of therapy, side effects and monitoring parameters, warnings and precautions, drug/food interactions, and storage, handling precautions, and disposal because they have taken the medication previously. The information in the declined sections below are for informational purposes only and was not discussed with patient.       Dupixent (dupilumab)    Medication & Administration     Dosage: Asthma, moderate to severe: Inject 600mg  under the skin as a loading dose followed by 300mg  every 14 days thereafter     Administration:     Dupixent Pen  1. Gather all supplies needed for injection on a clean, flat working surface: medication syringe removed from packaging, alcohol swab, sharps container, etc.  2. Look at the medication label - look for correct medication, correct dose, and check the expiration date  3. Look at the medication - the liquid in the pen should appear clear and colorless to pale yellow  4. Lay the pen on a flat surface and allow it to warm up to room temperature for at least 45 minutes  5. Select injection site - you can use the front of your thigh or your belly (but not the area 2 inches around your belly button); if someone else is giving you the injection you can also use your upper arm in the skin covering your triceps muscle  6. Prepare injection site - wash your hands and clean the skin at the injection site with an alcohol swab and let it air dry, do not touch the injection site again before the injection  7. Hold the middle of the body of the pen and gently pull the needle safety cap straight out. Be careful not to bend the needle. Do not remove until immediately prior to injection  8. Press the pen down onto the injection site at a 90 degree angle.   9. You will hear a click as the injection starts, and then a second click when the injection is ALMOST done. Keep holding the pen against the skin for 5 more seconds after the second click.   10. Check that the pen is empty by looking in the viewing window - the yellow indicator bar should be stopped, and should fill the window.   11. Remove the pen from the skin by lifting straight up.   12. Dispose of the used pen immediately in your sharps disposal container  13. If you see any blood at the injection site, press a cotton ball or gauze on the site and maintain pressure until the bleeding stops, do not rub the injection site    Adherence/Missed dose instructions:  If a dose is missed, administer within 7 days from the missed dose and then resume the original schedule. If the missed dose is not administered within 7 days, you can either wait until the next  dose on the original schedule or take your dose now and resume every 14 days from the new injection date. Do not use 2 doses at the same time or extra doses.      Goals of Therapy     -Reduce impairment - few night-time awakenings; maintenance of normal daily activities; optimization of lung function  -Minimal need (less than 2 days per week) of inhaled short acting beta agonists to relieve symptoms  -Prevention of recurrent exacerbations and need for emergency department/hospital care  -Prevention of reduced lung growth in children  -Maintenance of effective psychosocial functioning    Side Effects & Monitoring Parameters     Injection site reaction (redness, irritation, inflammation localized to the site of administration)  Signs of a common cold - minor sore throat, runny or stuffy nose, etc.  Recurrence of cold sores (herpes simplex)      The following side effects should be reported to the provider:  Signs of a hypersensitivity reaction - rash; hives; itching; red, swollen, blistered, or peeling skin; wheezing; tightness in the chest or throat; difficulty breathing, swallowing, or talking; swelling of the mouth, face, lips, tongue, or throat; etc.  Eye pain or irritation or any visual disturbances  Shortness of breath or worsening of breathing      Contraindications, Warnings, & Precautions     Have your bloodwork checked as you have been told by your prescriber   Birth control pills and other hormone-based birth control may not work as well to prevent pregnancy  Talk with your doctor if you are pregnant, planning to become pregnant, or breastfeeding  Discuss the possible need for holding your dose(s) of Dupixent?? when a planned procedure is scheduled with the prescriber as it may delay healing/recovery timeline       Drug/Food Interactions     Medication list reviewed in Epic. The patient was instructed to inform the care team before taking any new medications or supplements. No drug interactions identified.   Talk with you prescriber or pharmacist before receiving any live vaccinations while taking this medication and after you stop taking it    Storage, Handling Precautions, & Disposal     Store this medication in the refrigerator.  Do not freeze  If needed, you may store at room temperature for up to 14 days  Store in original packaging, protected from light  Do not shake  Dispose of used syringes in a sharps disposal container        Current Medications (including OTC/herbals), Comorbidities and Allergies     Current Outpatient Medications   Medication Sig Dispense Refill    albuterol 2.5 mg /3 mL (0.083 %) nebulizer solution Inhale 3 mL (2.5 mg total) by nebulization every four (4) hours as needed for wheezing. 60 mL 3    amlodipine (NORVASC) 5 MG tablet       donepezil (ARICEPT) 10 MG tablet Take 1 tablet (10 mg total) by mouth every morning. 90 tablet 3    dupilumab (DUPIXENT PEN) 300 mg/2 mL PnIj Inject the contents of 1 pen (300 mg total) under the skin every fourteen (14) days. 4 mL 5    EPINEPHrine (EPIPEN) 0.3 mg/0.3 mL injection Inject 0.3 mL (0.3 mg total) into the muscle once for 1 dose for severe allergic reaction. 2 each 0    escitalopram oxalate (LEXAPRO) 10 MG tablet Take 1 tablet (10 mg total) by mouth daily. 90 tablet 3    fluticasone propionate (FLONASE) 50 mcg/actuation nasal spray  LORazepam (ATIVAN) 0.5 MG tablet       losartan-hydroCHLOROthiazide (HYZAAR) 100-25 mg per tablet Take 1 tablet by mouth daily. 90 tablet 0    pravastatin (PRAVACHOL) 10 MG tablet       TRELEGY ELLIPTA 200-62.5-25 mcg DsDv        Current Facility-Administered Medications   Medication Dose Route Frequency Provider Last Rate Last Admin    albuterol (ACCUNEB) nebulizer solution 0.63 mg  0.63 mg Nebulization Once Regino Schultze, Tai, MD        albuterol nebulizer solution 2.5 mg  2.5 mg Nebulization Once Halyard, Laurian Brim, MD           Allergies   Allergen Reactions    Hydralazine-Reserpin-Hcthiazid Rash     Hypokalemia    Shellfish Containing Products Rash       Patient Active Problem List   Diagnosis    Cortical senile cataract    Mixed hyperlipidemia    Presbyopia    Scoliosis    Senile nuclear sclerosis    Diastolic dysfunction    Health maintenance examination    Essential hypertension    COPD with acute exacerbation    Prediabetes    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, psychotic disturbance, mood disturbance, or anxiety (CMS-HCC)    OSA (obstructive sleep apnea)    Pituitary adenoma    Seasonal allergic rhinitis    Vitamin D deficiency    Thrombocytosis    GERD (gastroesophageal reflux disease)    Hyperglycemia    Leukocytosis    Sinus tachycardia    Moderate persistent asthma with acute exacerbation    Severe asthma    Current moderate episode of major depressive disorder (CMS-HCC)       Medication list has been reviewed and updated in Epic: Yes    Allergies have been reviewed and updated in Epic: Yes    Appropriateness of Therapy     Acute infections noted within Epic:  No active infections  Patient reported infection: None    Is the medication and dose appropriate based on diagnosis, medication list, comorbidities, allergies, medical history, patient???s ability to self-administer the medication, and therapeutic goals? Yes    Prescription has been clinically reviewed: Yes      Baseline Quality of Life Assessment      How many days over the past month did your severe persistent  keep you from your normal activities? For example, brushing your teeth or getting up in the morning. Increased    Financial Information     Medication Assistance provided: Prior Authorization    Anticipated copay of $0 reviewed with patient. Verified delivery address.    Delivery Information     Scheduled delivery date: 06/10/23    Expected start date: Continuation of therapy - next dose due ~4/14    Medication will be delivered via Same Day Courier to the prescription address in [facility].  This shipment will not require a signature.      Explained the services we provide at [facility] and that each month we would call to set up refills.  Stressed importance of returning phone calls so that we could ensure they receive their medications in time each month.  Informed patient that we should be setting up refills 7-10 days prior to when they will run out of medication.  A pharmacist will reach out to perform a clinical assessment periodically.  Informed patient that a welcome packet, containing information about our pharmacy and  other support services, a Notice of Privacy Practices, and a drug information handout will be sent.      The patient or caregiver noted above participated in the development of this care plan and knows that they can request review of or adjustments to the care plan at any time.      Patient or caregiver verbalized understanding of the above information as well as how to contact the pharmacy at [phone] option 4 with any questions/concerns.  The pharmacy is open Monday through Friday 8:30am-4:30pm.  A pharmacist is available 24/7 via pager to answer any clinical questions they may have.    Patient Specific Needs     Does the patient have any physical, cognitive, or cultural barriers? No    Does the patient have adequate living arrangements? (i.e. the ability to store and take their medication appropriately) Yes    Did you identify any home environmental safety or security hazards? No    Patient prefers to have medications discussed with  Family Member     Is the patient or caregiver able to read and understand education materials at a high school level or above? Yes    Patient's primary language is  English     Is the patient high risk? No    Does the patient have an additional or emergency contact listed in their chart? Yes    SOCIAL DETERMINANTS OF HEALTH     At the [facility], we have learned that life circumstances - like trouble affording food, housing, utilities, or transportation can affect the health of many of our patients.   That is why we wanted to ask: are you currently experiencing any life circumstances that are negatively impacting your health and/or quality of life? Patient declined to answer    Social Drivers of Health     Food Insecurity: No Food Insecurity (12/27/2020)    Hunger Vital Sign     Worried About Running Out of Food in the Last Year: Never true     Ran Out of Food in the Last Year: Never true   Tobacco Use: Medium Risk (05/21/2023)    Patient History     Smoking Tobacco Use: Former     Smokeless Tobacco Use: Never     Passive Exposure: Not on file   Transportation Needs: No Transportation Needs (12/27/2020)    PRAPARE - Therapist, art (Medical): No     Lack of Transportation (Non-Medical): No   Alcohol Use: Not on file   Housing: Not on file   Physical Activity: Not on file   Utilities: Not on file   Stress: Not on file   Interpersonal Safety: Not on file   Substance Use: Not on file (01/09/2023)   Intimate Partner Violence: Not on file   Social Connections: Not on file   Financial Resource Strain: Low Risk  (12/27/2020)    Overall Financial Resource Strain (CARDIA)     Difficulty of Paying Living Expenses: Not hard at all   Depression: At risk (01/07/2022)    PHQ-2     PHQ-2 Score: 5   Internet Connectivity: Not on file   Health Literacy: Not on file       Would you be willing to receive help with any of the needs that you have identified today? Not applicable       Oliva Bustard, PharmD  [facility] Specialty Pharmacist

## 2023-06-04 NOTE — Unmapped (Signed)
 Spoke with nurse at [facility] stating they will need written order from provider stating that patient is to get one dupixent shot administered every two weeks.  Nurse asks if order can be faxed to [phone].  Routing to provider for review.

## 2023-06-15 ENCOUNTER — Other Ambulatory Visit: Payer: Self-pay

## 2023-06-15 ENCOUNTER — Emergency Department
Admission: EM | Admit: 2023-06-15 | Discharge: 2023-06-15 | Disposition: A | Discharge status: Home / Self Care | Attending: Emergency Medicine | Admitting: Emergency Medicine

## 2023-06-15 DIAGNOSIS — S39012A Strain of muscle, fascia and tendon of lower back, initial encounter: Secondary | ICD-10-CM | POA: Diagnosis not present

## 2023-06-15 DIAGNOSIS — F0390 Unspecified dementia without behavioral disturbance: Secondary | ICD-10-CM | POA: Diagnosis not present

## 2023-06-15 DIAGNOSIS — X58XXXA Exposure to other specified factors, initial encounter: Secondary | ICD-10-CM | POA: Diagnosis not present

## 2023-06-15 DIAGNOSIS — M545 Low back pain, unspecified: Secondary | ICD-10-CM | POA: Diagnosis present

## 2023-06-15 DIAGNOSIS — I10 Essential (primary) hypertension: Secondary | ICD-10-CM | POA: Diagnosis not present

## 2023-06-15 LAB — URINALYSIS, ROUTINE W REFLEX MICROSCOPIC
Bilirubin Urine: NEGATIVE
Glucose, UA: NEGATIVE mg/dL
Hgb urine dipstick: NEGATIVE
Ketones, ur: NEGATIVE mg/dL
Nitrite: NEGATIVE
Protein, ur: NEGATIVE mg/dL
Specific Gravity, Urine: 1.02 (ref 1.005–1.030)
pH: 7 (ref 5.0–8.0)

## 2023-06-15 MED ORDER — NAPROXEN 500 MG PO TABS
500.0000 mg | ORAL_TABLET | Freq: Once | ORAL | Status: AC
  Administered 2023-06-15: 500 mg via ORAL
  Filled 2023-06-15: qty 1

## 2023-06-15 MED ORDER — NAPROXEN 500 MG PO TABS: 500.0000 mg | ORAL_TABLET | Freq: Two times a day (BID) | ORAL | 2 refills | Status: AC

## 2023-06-15 MED ORDER — NAPROXEN 500 MG PO TABS: 500.0000 mg | ORAL_TABLET | Freq: Two times a day (BID) | ORAL | 2 refills | Status: DC

## 2023-06-15 NOTE — ED Provider Notes (Signed)
 [facility] Provider Note    Event Date/Time   First MD Initiated Contact with Patient 06/15/23 1111     (approximate)   History   Back Pain   HPI  Alyssa Santiago is a 73 y.o. female with a history of dementia, hypertension who presents with right low back pain, she does not report any falls or injuries.  She denies dysuria or frequency.  No loss of bowel or bladder continence, no numbness or neurodeficits, ambulating well.     Physical Exam   Triage Vital Signs: ED Triage Vitals  Encounter Vitals Group     BP 06/15/23 1013 137/81     Systolic BP Percentile --      Diastolic BP Percentile --      Pulse Rate 06/15/23 1013 73     Resp 06/15/23 1013 20     Temp 06/15/23 1013 98 F (36.7 C)     Temp Source 06/15/23 1013 Oral     SpO2 06/15/23 1013 98 %     Weight 06/15/23 1022 84.4 kg (186 lb 1.1 oz)     Height 06/15/23 1022 1.702 m (5\' 7" )     Head Circumference --      Peak Flow --      Pain Score 06/15/23 1013 10     Pain Loc --      Pain Education --      Exclude from Growth Chart --     Most recent vital signs: Vitals:   06/15/23 1013  BP: 137/81  Pulse: 73  Resp: 20  Temp: 98 F (36.7 C)  SpO2: 98%     General: Awake, no distress.  CV:  Good peripheral perfusion.  Resp:  Normal effort.  Abd:  No distention.  Other:  Back: No rash, no vertebral tenderness to palpation, point tenderness right lumbar paraspinal area suspicious for muscle spasm no CVA tenderness   ED Results / Procedures / Treatments   Labs (all labs ordered are listed, but only abnormal results are displayed) Labs Reviewed  URINALYSIS, ROUTINE W REFLEX MICROSCOPIC - Abnormal; Notable for the following components:      Result Value   Color, Urine YELLOW (*)    APPearance CLOUDY (*)    Leukocytes,Ua TRACE (*)    Bacteria, UA RARE (*)    All other components within normal limits     EKG     RADIOLOGY     PROCEDURES:  Critical Care  performed:   Procedures   MEDICATIONS ORDERED IN ED: Medications  naproxen (NAPROSYN) tablet 500 mg (500 mg Oral Given 06/15/23 1143)     IMPRESSION / MDM / ASSESSMENT AND PLAN / ED COURSE  I reviewed the triage vital signs and the nursing notes. Patient's presentation is most consistent with acute complicated illness / injury requiring diagnostic workup.  Patient presents with right low back pain as detailed above, suspect musculoskeletal pain, less likely UTI  Will treat with p.o. Naprosyn, pending urinalysis  Urinalysis is negative for UTI, appropriate for supportive care, outpatient follow-up with PCP as needed        FINAL CLINICAL IMPRESSION(S) / ED DIAGNOSES   Final diagnoses:  Strain of lumbar region, initial encounter     Rx / DC Orders   ED Discharge Orders          Ordered    naproxen (NAPROSYN) 500 MG tablet  2 times daily with meals,   Status:  Discontinued  06/15/23 1217    naproxen (NAPROSYN) 500 MG tablet  2 times daily with meals        06/15/23 1229             Note:  This document was prepared using Dragon voice recognition software and may include unintentional dictation errors.   Bryson Carbine, MD 06/15/23 1256

## 2023-06-15 NOTE — ED Triage Notes (Signed)
 Pt to ED via POV from home. Pt ambulatory to triage. Pt reports right lower back pain x3 days. No injury. Denies radiation. No urinary symptoms. No loss of bowel or bladder. No N/V/D. Pt reports some dizziness since back pain started.

## 2023-06-15 NOTE — ED Notes (Signed)
 See triage note Presents with daughter from [facility]  Having lower back pain for about 3 days Denies any injury. States it just catches at times No fever

## 2023-07-10 NOTE — Unmapped (Signed)
 [facility] Refill Coordination Note    Specialty Medication(s) to be Shipped:   CF/Pulmonary/Asthma: Dupixent    Other medication(s) to be shipped: No additional medications requested for fill at this time     Regina Vargas, DOB: [birth year]  Phone: [phone] (home)       All above HIPAA information was verified with patient's family member, daughter.     Was a Nurse, learning disability used for this call? No    Completed refill call assessment today to schedule patient's medication shipment from the [facility]  [phone]).  All relevant notes have been reviewed.     Specialty medication(s) and dose(s) confirmed: Regimen is correct and unchanged.   Changes to medications: Regina Vargas reports no changes at this time.  Changes to insurance: No  New side effects reported not previously addressed with a pharmacist or physician: None reported  Questions for the pharmacist: No    Confirmed patient received a Conservation officer, historic buildings and a Surveyor, mining with first shipment. The patient will receive a drug information handout for each medication shipped and additional FDA Medication Guides as required.       DISEASE/MEDICATION-SPECIFIC INFORMATION        For patients on injectable medications: Patient currently has 0 doses left.  Next injection is scheduled for 05/09.    SPECIALTY MEDICATION ADHERENCE     Medication Adherence    Patient reported X missed doses in the last month: 0  Specialty Medication: DUPIXENT PEN 300 mg/2 mL pen injector (dupilumab)  Patient is on additional specialty medications: No              Were doses missed due to medication being on hold? No    DUPIXENT PEN 300 mg/2 mL pen injector (dupilumab): 0 doses of medicine on hand       REFERRAL TO PHARMACIST     Referral to the pharmacist: Not needed      [facility]     Shipping address confirmed in Epic.     Cost and Payment: Patient has a $0 copay, payment information is not required.    Delivery Scheduled: Yes, Expected medication delivery date: 05/09.     Medication will be delivered via Same Day Courier to the prescription address in Epic [facility].    Regina Vargas   [place] [facility]  Specialty Technician

## 2023-07-11 MED FILL — DUPIXENT 300 MG/2 ML SUBCUTANEOUS PEN INJECTOR: SUBCUTANEOUS | 28 days supply | Qty: 4 | Fill #1

## 2023-08-01 ENCOUNTER — Ambulatory Visit: Admit: 2023-08-01 | Payer: Medicare (Managed Care)

## 2023-08-07 ENCOUNTER — Ambulatory Visit
Admit: 2023-08-07 | Discharge: 2023-08-08 | Payer: Medicare (Managed Care) | Attending: Family Medicine | Primary: Family Medicine

## 2023-08-07 DIAGNOSIS — F02B 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, psychotic disturbance, mood disturbance, or anxiety: Principal | ICD-10-CM

## 2023-08-07 DIAGNOSIS — R350 Frequency of micturition: Principal | ICD-10-CM

## 2023-08-07 DIAGNOSIS — I10 Essential (primary) hypertension: Principal | ICD-10-CM

## 2023-08-07 DIAGNOSIS — J4541 Moderate persistent asthma with (acute) exacerbation: Principal | ICD-10-CM

## 2023-08-07 DIAGNOSIS — G301 Alzheimer's disease with late onset: Principal | ICD-10-CM

## 2023-08-07 MED ORDER — PREDNISONE 20 MG TABLET
ORAL_TABLET | Freq: Every day | ORAL | 0 refills | 5.00000 days | Status: CP
Start: 2023-08-07 — End: 2023-08-12

## 2023-08-07 MED ORDER — ALBUTEROL SULFATE 2.5 MG/3 ML (0.083 %) SOLUTION FOR NEBULIZATION
RESPIRATORY_TRACT | 3 refills | 4.00000 days | Status: CP | PRN
Start: 2023-08-07 — End: 2023-11-05

## 2023-08-07 MED ORDER — TRELEGY ELLIPTA 200 MCG-62.5 MCG-25 MCG POWDER FOR INHALATION
Freq: Every day | RESPIRATORY_TRACT | 3 refills | 60.00000 days | Status: CP
Start: 2023-08-07 — End: ?

## 2023-08-07 MED ORDER — AMLODIPINE 5 MG TABLET
ORAL_TABLET | Freq: Every day | ORAL | 2 refills | 30.00000 days | Status: CP
Start: 2023-08-07 — End: ?

## 2023-08-07 NOTE — Unmapped (Addendum)
-   condition ongoing  - orthostatic blood pressure today stable, pt complains of increasing dizziness with standing   - Supine: 146/84   - Sitting: 152/85   - Standing: 153/90  - patient should monitor blood pressure at home for one week  - continue Amlodipine 5 mg as ordered  - follow-up with Dr. Margretta Shi within the next week  - education provided on signs and symptoms of worsening disease and when to seek further care  Orders:    amlodipine (NORVASC) 5 MG tablet; Take 1 tablet (5 mg total) by mouth daily.

## 2023-08-07 NOTE — Unmapped (Addendum)
-   condition ongoing  - education about disease process provided  - support and reassurance provided

## 2023-08-07 NOTE — Unmapped (Signed)
 Hypertension:  - Keep a log of daily blood pressure every morning  - Write these down to bring to follow up appointment  - Take your Amlodipine 5 mg by mouth daily  - Follow-up with Dr. Margretta Shi within the next week    Asthma:   - Take your Trelegy inhaler 1 puff daily  - Start prednisone 40 mg (2 20 mg tablets) by mouth every day for five days  - Take nebulized albuterol as needed for symptom control

## 2023-08-07 NOTE — Unmapped (Addendum)
-   acute exacerbation  - continue maintenance medication Trelegy inhaler, 1 puff daily  - start prednisone 40 mg for acute episode for five days  - education provided for worsening symptoms and when to seek emergency care  Orders:    TRELEGY ELLIPTA 200-62.5-25 mcg inhaler; Inhale 1 puff daily.    albuterol 2.5 mg /3 mL (0.083 %) nebulizer solution; Inhale 3 mL (2.5 mg total) by nebulization every four (4) hours as needed for wheezing.    predniSONE (DELTASONE) 20 MG tablet; Take 2 tablets (40 mg total) by mouth daily for 5 days.

## 2023-08-07 NOTE — Unmapped (Signed)
 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    albuterol 2.5 mg /3 mL (0.083 %) nebulizer solution; Inhale 3 mL (2.5 mg total) by nebulization every four (4) hours as needed for wheezing.    predniSONE (DELTASONE) 20 MG tablet; Take 2 tablets (40 mg total) by mouth daily for 5 days.    Essential hypertension  - condition ongoing  - orthostatic blood pressure today stable, pt complains of increasing dizziness with standing   - Supine: 146/84   - Sitting: 152/85   - Standing: 153/90  - patient should monitor blood pressure at home for one week  - continue Amlodipine 5 mg as ordered  - follow-up with Dr. Margretta Shi within the next week  - education provided on signs and symptoms of worsening disease and when to seek further care  Orders:    amlodipine (NORVASC) 5 MG tablet; Take 1 tablet (5 mg total) by mouth daily.    Urinary frequency  - urinalysis ordered to r/o UTI  - education on etiology, diagnosis, and treatment provided  Orders:    Urinalysis with Microscopy with Culture Reflex        SUBJECTIVE    Subjective   [name] who presents with had concerns including Follow-up (Medication refills, discuss cough for a month , memory loss, and dizziness).     Here with daughter    Wheezing:   Patient here for presentation of wheezing and cough. States that this started a week and a half ago. Patient describes the cough as non-productive and consistent. Patient had similar symptoms in March and was treated with prednisone. The symptoms went away and came back.     UTI:  Patient presents with urinary frequency. Patient's daughter is concerned that she may have a UTI because she is having mood swings and more irritation than normal.     Dizziness:   Patient presents with complaints with dizziness. States that it is worse with walking and standing from sitting. States that this has been going on for several months and getting worse. Does not take her blood pressure at home. States that she feels like she is going to fall when standing. Denies room spinning, vision changes, or weakness.     REVIEW OF SYSTEMS    Past medical history, medications, social history, and allergies reviewed and updated.  Remainder of 10 of 13 systems review negative except noted in HPI.         Objective     OBJECTIVE  BP 157/85 (BP Position: Sitting, BP Cuff Size: Medium)  - Pulse 78  - Temp 36.3 ??C (97.4 ??F) (Temporal)  - Wt 91.1 kg (200 lb 12.8 oz)  - LMP  (LMP Unknown)  - BMI 31.45 kg/m??    Physical Examination  Vital Signs- reviewed  General appearance - alert, well appearing, and in no distress  Mouth - mucous membranes moist  Neck - supple,  no significant adenopathy  Chest - clear to auscultation, no wheezes, rales or rhonchi, symmetric air entry  Heart - normal rate, regular rhythm without murmurs, rubs, clicks or gallops  Neurological - alert, oriented, normal speech, no focal findings or movement disorder noted  Extremities - no pedal edema, no clubbing or cyanosis    Labs, Imaging, and Other Clinical Data:  I have reviewed the labs, imaging studies, and other clinical data associated with this encounter.  See Epic Labs and Imaging section for details.       Regina Hartmann, PA-S

## 2023-08-11 NOTE — Unmapped (Signed)
 Copied from CRM [id number]. Topic: Access To Clinicians - Req Clinic Call Back  >> Aug 11, 2023 11:35 AM Floreen Hunger wrote:  Clinical Advice: Clarification of provider instructions:They need further explanation of instructions given during their last visit, date Daughter called and stated they received the results of the lab work (urine) and it looked abnormal.  Daughter wanted to know if medication was going to be called into     [facility] [address] ([address]), [place] - [address]  [address]) [place] [address]  Phone: [phone] Fax: [phone]    Patient daughter not requesting a call back      They have been seen for this issue previously.  The guardian, [name] (daughter),  preferred contact: Cell Phone - [phone] Routine callback turnaround time: 24-48 business hours. Programmer, systems Notified)

## 2023-08-11 NOTE — Unmapped (Signed)
 [facility] Refill Coordination Note    Specialty Medication(s) to be Shipped:   CF/Pulmonary/Asthma: Dupixent    Other medication(s) to be shipped: No additional medications requested for fill at this time     Regina Vargas, DOB: 1950-04-28  Phone: [phone] (home)       All above HIPAA information was verified with patient's family member, Daughter.     Was a Nurse, learning disability used for this call? No    Completed refill call assessment today to schedule patient's medication shipment from the [facility]  [phone]).  All relevant notes have been reviewed.     Specialty medication(s) and dose(s) confirmed: Regimen is correct and unchanged.   Changes to medications: Regina Vargas reports no changes at this time.  Changes to insurance: No  New side effects reported not previously addressed with a pharmacist or physician: None reported  Questions for the pharmacist: No    Confirmed patient received a Conservation officer, historic buildings and a Surveyor, mining with first shipment. The patient will receive a drug information handout for each medication shipped and additional FDA Medication Guides as required.       DISEASE/MEDICATION-SPECIFIC INFORMATION        For patients on injectable medications: Patient currently has 0 doses left.  Next injection is scheduled for 06/16.    SPECIALTY MEDICATION ADHERENCE     Medication Adherence    Patient reported X missed doses in the last month: 0  Specialty Medication: DUPIXENT PEN 300 mg/2 mL pen injector (dupilumab)  Patient is on additional specialty medications: No              Were doses missed due to medication being on hold? No    DUPIXENT PEN 300 mg/2 mL pen injector (dupilumab) : 1 doses of medicine on hand       REFERRAL TO PHARMACIST     Referral to the pharmacist: Not needed      SHIPPING     Shipping address confirmed in Epic.     Cost and Payment: Patient has a $0 copay, payment information is not required.    Delivery Scheduled: Yes, Expected medication delivery date: 08/15/23.     Medication will be delivered via Same Day Courier to the prescription address in Epic WAM.    Regina Vargas   [facility]  Specialty Technician

## 2023-08-12 DIAGNOSIS — F02B3 Moderate late onset Alzheimer's dementia with mood disturbance (CMS-HCC): Principal | ICD-10-CM

## 2023-08-12 DIAGNOSIS — I10 Essential (primary) hypertension: Principal | ICD-10-CM

## 2023-08-12 DIAGNOSIS — G301 Alzheimer's disease with late onset: Principal | ICD-10-CM

## 2023-08-12 MED ORDER — LOSARTAN 100 MG-HYDROCHLOROTHIAZIDE 25 MG TABLET
ORAL_TABLET | Freq: Every day | ORAL | 0 refills | 90.00000 days | Status: CP
Start: 2023-08-12 — End: ?

## 2023-08-12 MED ORDER — PRAVASTATIN 10 MG TABLET
ORAL_TABLET | Freq: Every day | ORAL | 3 refills | 90.00000 days | Status: CP
Start: 2023-08-12 — End: ?

## 2023-08-12 MED ORDER — DONEPEZIL 10 MG TABLET
ORAL_TABLET | Freq: Every morning | ORAL | 3 refills | 90.00000 days | Status: CP
Start: 2023-08-12 — End: 2024-08-06

## 2023-08-12 MED ORDER — ESCITALOPRAM 10 MG TABLET
ORAL_TABLET | Freq: Every day | ORAL | 3 refills | 90.00000 days | Status: CP
Start: 2023-08-12 — End: 2024-08-11

## 2023-08-12 NOTE — Unmapped (Signed)
 Refill request sent to Dr. Margretta Shi.

## 2023-08-13 MED ORDER — FLUTICASONE PROPIONATE 50 MCG/ACTUATION NASAL SPRAY,SUSPENSION
Freq: Every day | NASAL | 1 refills | 120.00000 days | Status: CP
Start: 2023-08-13 — End: ?

## 2023-08-13 NOTE — Unmapped (Signed)
 Copied from CRM #[id number]. Topic: Access To Clinicians - Req Clinic Call Back  >> Aug 13, 2023 10:52 AM Andy Keen wrote:  Reason of the Call: Patients daughter [name] would like to receive a call back from the patients social worker. [name] is looking for clarification and assistance with getting the patient admitted to a facility. Patient would like clarification on the HMP form.     Requesting a call back from a Child psychotherapist for clarification and assistance      Supporting details:    Appointment: The patient is scheduled for 6/13.        Arnetha Bhat preferred contact: Cell Phone  Routine callback turnaround time: 24-48 business hours. Programmer, systems Notified)

## 2023-08-13 NOTE — Unmapped (Signed)
 Refill request sent to Dr. Margretta Shi. Medication is not updated on patient's list.

## 2023-08-13 NOTE — Unmapped (Signed)
 Care Management Progress Note  [facility]                 Date of Service:  08/13/2023     Service: Care Management - phone    Purpose of contact:        CM contacted patient daughter Regina Vargas would like to receive a call back from the patients social worker. Regina Vargas is looking for clarification and assistance with getting the patient admitted to a facility. Patient would like clarification on the HMP form.     Interventions Provided:  Patient daughter shared that she was currently in the middle of something asked CM to send a message through [place] that provided CM information.     Additional Information/Plan:  CM will support patient patient as needed.        Regina Vargas [facility]   [facility]   [phone]

## 2023-08-15 ENCOUNTER — Encounter: Admit: 2023-08-15 | Discharge: 2023-08-16 | Payer: Medicare (Managed Care)

## 2023-08-15 ENCOUNTER — Inpatient Hospital Stay: Admit: 2023-08-15 | Discharge: 2023-08-16 | Payer: Medicare (Managed Care)

## 2023-08-15 MED ORDER — FLUTICASONE PROPIONATE 50 MCG/ACTUATION NASAL SPRAY,SUSPENSION
Freq: Every day | NASAL | 1 refills | 120.00000 days | Status: CP
Start: 2023-08-15 — End: ?

## 2023-08-15 MED ORDER — LORAZEPAM 1 MG TABLET
ORAL_TABLET | Freq: Three times a day (TID) | ORAL | 0 refills | 7.00000 days | Status: CP | PRN
Start: 2023-08-15 — End: ?

## 2023-08-15 MED ORDER — EPINEPHRINE 0.3 MG/0.3 ML INJECTION, AUTO-INJECTOR
Freq: Once | INTRAMUSCULAR | 0 refills | 2.00000 days | Status: CP
Start: 2023-08-15 — End: 2023-08-15

## 2023-08-15 MED ORDER — DONEPEZIL 10 MG TABLET
ORAL_TABLET | Freq: Every morning | ORAL | 3 refills | 90.00000 days | Status: CP
Start: 2023-08-15 — End: 2024-08-09

## 2023-08-15 MED ORDER — ALBUTEROL SULFATE 2.5 MG/3 ML (0.083 %) SOLUTION FOR NEBULIZATION
RESPIRATORY_TRACT | 3 refills | 4.00000 days | Status: CP | PRN
Start: 2023-08-15 — End: 2023-11-13

## 2023-08-15 MED ORDER — LOSARTAN 100 MG-HYDROCHLOROTHIAZIDE 25 MG TABLET
ORAL_TABLET | Freq: Every day | ORAL | 0 refills | 90.00000 days | Status: CP
Start: 2023-08-15 — End: 2023-08-15

## 2023-08-15 MED ORDER — ESCITALOPRAM 10 MG TABLET
ORAL_TABLET | Freq: Every day | ORAL | 3 refills | 90.00000 days | Status: CP
Start: 2023-08-15 — End: 2024-08-14

## 2023-08-15 MED ORDER — PRAVASTATIN 10 MG TABLET
ORAL_TABLET | Freq: Every day | ORAL | 3 refills | 90.00000 days | Status: CP
Start: 2023-08-15 — End: ?

## 2023-08-15 MED ORDER — TRELEGY ELLIPTA 200 MCG-62.5 MCG-25 MCG POWDER FOR INHALATION
Freq: Every day | RESPIRATORY_TRACT | 3 refills | 60.00000 days | Status: CP
Start: 2023-08-15 — End: ?

## 2023-08-15 MED FILL — DUPIXENT 300 MG/2 ML SUBCUTANEOUS PEN INJECTOR: SUBCUTANEOUS | 28 days supply | Qty: 4 | Fill #2

## 2023-08-15 NOTE — Unmapped (Signed)
 ASSESSMENT/PLAN    Assessment & Plan  Moderate persistent asthma with acute exacerbation (HHS-HCC)  Asthma controlled at this time per patient. Caretaker's advised on indications of Patient's increased asthma symptoms and use of albuterol.       Orders:    TRELEGY ELLIPTA 200-62.5-25 mcg inhaler; Inhale 1 puff daily.    albuterol 2.5 mg /3 mL (0.083 %) nebulizer solution; Inhale 3 mL (2.5 mg total) by nebulization every four (4) hours as needed for wheezing.    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    LORazepam (ATIVAN) 1 MG tablet; Take 1 tablet (1 mg total) by mouth every eight (8) hours as needed for anxiety.    Severe persistent steroid-dependent asthma with acute exacerbation    Asthma controlled at this time per Patient. Caretaker's advised on indications of Patient's increased asthma symptoms and use of albuterol.       Orders:    EPINEPHrine (EPIPEN) 0.3 mg/0.3 mL injection; Inject 0.3 mL (0.3 mg total) into the muscle once for 1 dose for severe allergic reaction.    Hypertension, unspecified type  BP controlled based on clinic measurement. Caretaker reports that patient has not taken Hyzaar since released from facility in end of May. Patient's recent balance instability could likely exacerbate condition. Advised Caretaker to discontinue Hyzaar and to monitor Patient's BP for time being without medication and follow up if BP is consistently >140/90.   Orders:    losartan-hydroCHLOROthiazide (HYZAAR) 100-25 mg per tablet; Take 1 tablet by mouth daily.    Pituitary adenoma    Condition is controlled at this time.         Recurrent major depressive disorder, in partial remission (CMS-HCC)  Anxiety controlled based on clinical observations, caretakers counseled on warning signs of depression.        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   Orders:    LORazepam (ATIVAN) 1 MG tablet; Take 1 tablet (1 mg total) by mouth every eight (8) hours as needed for anxiety.      I personally spent 35 minutes face-to-face and non-face-to-face in the care of this patient, which includes all pre, intra, and post visit time on the date of service.    SUBJECTIVE    Subjective   Regina Vargas is a 73 y.o. female accompanied by daughter, Regina Vargas, presents with concern for recent tremors and form completion to place [name] in a facility.      Tremor  One month history of lefthand tremor that comes and goes. Oldest daughter lives with mom and patient is able to feed and dress herself but unable to cook or drive. Patient reports postural and balance instability occurring when going from sitting to standing. Orientation to name and self. Not oriented date and location.     Mood/Behavior  Patient's daughter, Regina,comments that she is more irritable and fussy. Patient's daughter and staff from previous facility,recommend increasing Lorazepam 0.5 mg to 1mg  due to increased irritation and outbreaks. No falls within the last 3 to 6 month. Left facility on May 30th and states that she is not taking losartan-hydrochlorothiazide since leaving the facility. BP controlled based on clinic measurements, caretaker will maintain BP long at home.  REVIEW OF SYSTEMS    Past medical history, medications, social history, and allergies reviewed and updated.  Remainder of 10 of 13 systems review negative except sudden tremor in left hand and increased symptoms of dementia.       Objective     OBJECTIVE  BP 137/75 (BP Site: R Arm, BP Position: Sitting, BP Cuff Size: X-Large)  - Pulse 69  - Temp 36.6 ??C (97.8 ??F) (Temporal)  - Ht 170.2 cm (5' 7)  - Wt 93.7 kg (206 lb 9.6 oz)  - LMP  (LMP Unknown)  - BMI 32.36 kg/m??    Physical Examination  Vital Signs- reviewed  General appearance - alert, well appearing, and in no distress  Mental status - alert, oriented to person and place. Not oriented to date or current state.   Eyes - pupils equal and reactive, extraocular eye movements intact  Ears - bilateral TM's and external ear canals normal  Neck - supple, no significant adenopathy  Chest - clear to auscultation, no wheezes, rales or rhonchi, symmetric air entry  Heart - normal rate, regular rhythm without murmurs, rubs, clicks or gallops  Abdomen - soft, nontender, nondistended, no masses or organomegaly  Neurological - alert, normal speech, tremor noted on left hand.   Extremities - no pedal edema, no clubbing or cyanosis  Skin - normal coloration and turgor, no rashes, no suspicious skin lesions noted        Labs, Imaging, and Other Clinical Data:  I have reviewed the labs, imaging studies, and other clinical data associated with this encounter.  See Epic Labs and Imaging section for details.

## 2023-08-15 NOTE — Unmapped (Addendum)
 Schedule follow up with your Neurologist Dr Drucilla  To schedule your Neurology appointment: Please call [phone]    [facility] Manager   [facility]   [phone]

## 2023-08-15 NOTE — Unmapped (Addendum)
 Increased irritation and outbreaks according to caretaker.   Orders:    LORazepam (ATIVAN) 1 MG tablet; Take 1 tablet (1 mg total) by mouth every eight (8) hours as needed for anxiety.

## 2023-08-15 NOTE — Unmapped (Addendum)
 Condition is controlled at this time.

## 2023-08-15 NOTE — Unmapped (Addendum)
 Anxiety controlled based on clinical observations, caretakers counseled on warning signs of depression.

## 2023-08-15 NOTE — Unmapped (Addendum)
 Asthma controlled at this time per patient. Caretaker's advised on indications of Patient's increased asthma symptoms and use of albuterol.       Orders:    TRELEGY ELLIPTA 200-62.5-25 mcg inhaler; Inhale 1 puff daily.    albuterol 2.5 mg /3 mL (0.083 %) nebulizer solution; Inhale 3 mL (2.5 mg total) by nebulization every four (4) hours as needed for wheezing.

## 2023-08-20 LAB — TB AG1: TB AG1 VALUE: 0.06

## 2023-08-20 LAB — QUANTIFERON TB GOLD PLUS
QUANTIFERON ANTIGEN 1 MINUS NIL: 0 [IU]/mL
QUANTIFERON ANTIGEN 2 MINUS NIL: 0.03 [IU]/mL
QUANTIFERON MITOGEN: 9.94 [IU]/mL
QUANTIFERON TB GOLD PLUS: NEGATIVE
QUANTIFERON TB NIL VALUE: 0.06 [IU]/mL

## 2023-08-20 LAB — TB NIL: TB NIL VALUE: 0.06

## 2023-08-20 LAB — TB AG2: TB AG2 VALUE: 0.09

## 2023-08-20 LAB — TB MITOGEN: TB MITOGEN VALUE: 10

## 2023-08-28 NOTE — Unmapped (Signed)
 Assessment/Plan:    # Cataract both eyes  -moderate  -Reviewed findings & diagnosis; all questions answered. Discussed options including observation, new corrective lenses, or surgery with Phaco with IOL.  -Pt. satisfied with vision and does not desire sx at this time.   -Rx for specs was given.   -monitor  -RTC 1 year or sooner if vision changes.    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  -Observation  -RTC for follow-up PRN.    # Peripheral drusen both eyes  -monitor

## 2023-08-29 DIAGNOSIS — H524 Presbyopia: Principal | ICD-10-CM

## 2023-08-29 DIAGNOSIS — H52203 Unspecified astigmatism, bilateral: Principal | ICD-10-CM

## 2023-08-29 DIAGNOSIS — H2513 Age-related nuclear cataract, bilateral: Principal | ICD-10-CM

## 2023-08-29 DIAGNOSIS — H35363 Drusen (degenerative) of macula, bilateral: Principal | ICD-10-CM

## 2023-08-29 NOTE — Unmapped (Addendum)
 I have placed a form in your box requiring signature.     Type of Form - Applicant Medical Information         Order/Tracking # n/a      Thank You

## 2023-09-01 NOTE — Unmapped (Signed)
 Form Processing Form Left By: Self (Patient)  Type of Form: CAP/DA  Completed Form Delivery: MyChart and pick up  Last seen in-person: 08/15/2023  Last telemedicine visit: Visit date not found  Date needed: 5-7 days      Scheduled Appt Date (if needed within 7 days):       CAP/DA form needs completion. Patient wishes a call when ready. Please place a completed copy in Mychart. Form scanned to media. Patient also request update on previous forms sent in on June 19th.

## 2023-09-11 NOTE — Unmapped (Signed)
 [facility] Refill Coordination Note    Specialty Medication(s) to be Shipped:   CF/Pulmonary/Asthma: Dupixent     Other medication(s) to be shipped: No additional medications requested for fill at this time     Regina Vargas, DOB: 1950-07-29  Phone: [phone] (home)       All above HIPAA information was verified with patient's family member, Rilla.     Was a Nurse, learning disability used for this call? No    Completed refill call assessment today to schedule patient's medication shipment from the [facility]  [phone]).  All relevant notes have been reviewed.     Specialty medication(s) and dose(s) confirmed: Regimen is correct and unchanged.   Changes to medications: Roschelle reports no changes at this time.  Changes to insurance: No  New side effects reported not previously addressed with a pharmacist or physician: None reported  Questions for the pharmacist: No    Confirmed patient received a Conservation officer, historic buildings and a Surveyor, mining with first shipment. The patient will receive a drug information handout for each medication shipped and additional FDA Medication Guides as required.       DISEASE/MEDICATION-SPECIFIC INFORMATION        For patients on injectable medications: Patient currently has 1 doses left.  Next injection is scheduled for 09/15/23.    SPECIALTY MEDICATION ADHERENCE     Medication Adherence    Patient reported X missed doses in the last month: 0  Specialty Medication: DUPIXENT  PEN 300 mg/2 mL pen injector (dupilumab )  Patient is on additional specialty medications: No              Were doses missed due to medication being on hold? No    DUPIXENT  PEN 300 mg/2 mL pen injector (dupilumab ): 1 doses of medicine on hand     REFERRAL TO PHARMACIST     Referral to the pharmacist: Not needed      [facility]     Shipping address confirmed in Epic.     Cost and Payment: Patient has a $0 copay, payment information is not required.    Delivery Scheduled: Yes, Expected medication delivery date: 09/12/23.     Medication will be delivered via Same Day Courier to the prescription address in Epic WAM.    Shaydon Lease   [facility]  Specialty Technician

## 2023-09-12 MED FILL — DUPIXENT 300 MG/2 ML SUBCUTANEOUS PEN INJECTOR: SUBCUTANEOUS | 28 days supply | Qty: 4 | Fill #3

## 2023-09-17 NOTE — Unmapped (Signed)
 Hello, I have placed a form in your box requiring signature.    Type of Form: CAP/DA Forms  [facility]     Order/Tracking #: N/A     Thank You

## 2023-09-17 NOTE — Unmapped (Signed)
 [facility] has received an incoming clinical call:    Caller name: Torain,Jeanette   Best callback number: [phone]  Relationship to Patient: Daughter   Describe the reason for the call:       Patient daughter is calling because she still has not gotten a response in regards to the message she sent on MyChart on 09/10/2023.     She dropped of this paperwork on 08/21/2023 and came back on the 09/01/2023 to check on the status and they told them that there was still a wait and there would be an additional  5-7 days.     She dropped some more paperwork after that which the admin transcibed and it has been over 15 days on that paperwork.     This has been over 30 days now that she still has not gotten her moms paperwork back. She would like a call back as soon as possible in regards to what the status is or the hold up.

## 2023-09-22 NOTE — Unmapped (Signed)
 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 Requires assistance with daily activities. Family seeking care facility placement due to management difficulties.  - Complete and submit paperwork for facility placement.  - Educated family on dementia progression and management.         Right low back pain    Right-sided back pain for two weeks, likely muscle strain, possibly lumbar arthritis. No inciting injury or fall. No neuropathy or radiating pain.   - Recommend lidocaine patches for localized pain relief.  - Refer to physical therapy for evaluation and strengthening exercises.  - Instruct family to monitor pain and contact provider if symptoms worsen or change.  - Consider imaging if pain persists or worsens despite treatment.         Relevant Orders    AMB REFERRAL TO PHYSICAL THERAPY            Chief Complaint   Patient presents with    Back Pain     Right back side    paperwork completion     FL2 form       SUBJECTIVE:    Regina Vargas is a 73 y.o. female that presents to clinic today regarding the following issues:    History of Present Illness  Regina Vargas is a 73 year old female with dementia who presents with back pain and paperwork for facility placement. She is accompanied by her daughter, who is her primary caregiver.    She has been experiencing right-sided back pain for about two weeks, localized to the right lower back and exacerbated by movements such as leaning forward and touching her toes. Previously, the pain was managed with medication at a facility, which provided relief, but it has since returned. Currently, she is using Tylenol  for pain management.    Her dementia has been progressively impacting her daily life. She requires assistance with personal care, including showering and medication management, as she often forgets if she has taken her medications. She has episodes of incontinence and has used inappropriate places for toileting, such as a trash can. She also needs guidance with cooking and other daily activities. Her memory issues include repeating herself and not recognizing familiar people or situations.    Her daughter reports that she sometimes becomes verbally upset and denies actions she has taken. She also experiences episodes of confusion, such as mixing salad dressing with milk. There is concern about her safety at home, as she may let strangers into the house without questioning them.    No current pain during the visit. She experiences shortness of breath with minimal exertion, such as walking from her bedroom to the living room. She has had episodes of nausea and vomiting.      I have reviewed the patients problem list, current medications, allergies, and social history and updated them as needed.    Regina Vargas  reports that she has quit smoking. Her smoking use included cigarettes. She has never used smokeless tobacco.    OBJECTIVE:  Vitals:    09/22/23 0814   BP: 137/77   BP Site: R Arm   BP Position: Sitting   BP Cuff Size: Medium   Pulse: 66   Temp: 37 ??C (98.6 ??F)   TempSrc: Skin   Weight: 93.9 kg (207 lb)  Height: 170.2 cm (5' 7)      Physical Exam  Constitutional:       Appearance: Normal appearance.   HENT:      Head: Normocephalic and atraumatic.      Right Ear: Tympanic membrane normal.      Left Ear: Tympanic membrane normal.      Mouth/Throat:      Mouth: Mucous membranes are moist.      Pharynx: Oropharynx is clear.   Eyes:      Extraocular Movements: Extraocular movements intact.      Pupils: Pupils are equal, round, and reactive to light.   Cardiovascular:      Rate and Rhythm: Normal rate and regular rhythm.   Pulmonary:      Effort: Pulmonary effort is normal.      Breath sounds: Normal breath sounds.   Abdominal:      Palpations: There is no mass.      Tenderness: There is no abdominal tenderness. There is no guarding or rebound.   Musculoskeletal:         General: Normal range of motion.      Comments: No tenderness to palpation at spine or paraspinal muscles. Pain with forward and lateral truncal flexion (both sides), negative straight leg test.    Lymphadenopathy:      Cervical: No cervical adenopathy.   Skin:     General: Skin is warm and dry.   Neurological:      General: No focal deficit present.      Mental Status: She is alert and oriented to person, place, and time.   Psychiatric:         Mood and Affect: Mood normal.         Behavior: Behavior normal.           Soyla Sharps MD, (he/him)  Resident Physician, PGY- 2  Department of Family Medicine

## 2023-09-22 NOTE — Unmapped (Signed)
 Progressive dementia with significant cognitive decline. Requires assistance with daily activities. Family seeking care facility placement due to management difficulties.  - Complete and submit paperwork for facility placement.  - Educated family on dementia progression and management.

## 2023-09-22 NOTE — Unmapped (Signed)
I saw and evaluated the patient, participating in the key portions of the service.  I reviewed the residents note.  I agree with the residents findings and plan. Joseph Pierini, MD

## 2023-09-22 NOTE — Unmapped (Addendum)
 Right-sided back pain for two weeks, likely muscle strain, possibly lumbar arthritis. No inciting injury or fall. No neuropathy or radiating pain.   - Recommend lidocaine patches for localized pain relief.  - Refer to physical therapy for evaluation and strengthening exercises.  - Instruct family to monitor pain and contact provider if symptoms worsen or change.  - Consider imaging if pain persists or worsens despite treatment.

## 2023-10-03 MED ORDER — AMLODIPINE 10 MG TABLET
ORAL_TABLET | Freq: Every day | ORAL | 3 refills | 100.00000 days | Status: CP
Start: 2023-10-03 — End: ?

## 2023-10-03 MED ORDER — ALBUTEROL SULFATE HFA 90 MCG/ACTUATION AEROSOL INHALER
Freq: Four times a day (QID) | RESPIRATORY_TRACT | 0 refills | 0.00000 days | Status: CP | PRN
Start: 2023-10-03 — End: 2024-10-02

## 2023-10-03 NOTE — Unmapped (Signed)
 To schedule your Neurology appointment: Please call [phone]

## 2023-10-03 NOTE — Unmapped (Signed)
 [facility]  Established Patient Clinic Note    Assessment/Plan:   Regina Vargas is a 73 y.o.female    Dementia without behavioral disturbance, [place]  Alert, oriented to person and place (but said hospital first) but not date/month. FAST 5; Currently on donepezil  10mg . MOCA 10/30 in 12/2021. 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 TSH 1.6. RPR neg, Vit B12 707. Has concerns for possible injurious behaviors including leaving the stove on/oven on and cannot remember how to operate machines, difficulty making her own food. Is able to dress herself but needs assistance with bathing, food preparation. Intermittent incontinent and uses depends. Currently lives with family members: daughter Alvenia).    - Continue donepezil    - MRI to follow up pituitary microadenoma   - Recommended follow up with neurology   - Appropriate for memory care facility; family is interested in placement likely in the next few weeks    Moderate Persistent Asthma-COPD  Last exacerbation 08/2023; LAST PFTs 05/2023  - continue maintenance medication Trelegy inhaler, 1 puff daily  - albuterol  nebulizer  - dupixent  per pulmonology  - education provided for worsening symptoms and when to seek emergency care    Concern for OSA  -PSG ordered and awaiting scheduling    Hypertension  BP uncontrolled based on clinic measurement.   - losartan -hydroCHLOROthiazide  (HYZAAR ) 100-25 mg per tablet; Take 1 tablet by mouth daily.  -Increase amlodipine  to 10mg  from 5mg     Prediabetes  Lab Results   Component Value Date    A1C 5.8 (H) 05/16/2023   - Repeat in 05/2024  - Counseled on avoidance of refined carbohydrates    Cataract - Hyperopia  Managed by Ophthalmology    HLD  Continue pravastatin  10mg     Mood Disorder - Depression  Well controlled on lexapro  10mg     Pituitary Microadenoma   - Repeat MRI for follow up    Subjective   Regina Vargas is a 73 y.o. female  coming to clinic today for the following issues:    Chief Complaint   Patient presents with    Follow-up     Paperwork , memory loss , headaches,light headed short of breath      HPI:    History of Present Illness  Regina Vargas is a 73 year old female with dementia who presents for paperwork and documentation for facility placement. She is accompanied by her daughter, Rilla, who is her primary caregiver.    She requires completion of paperwork and documentation for facility placement, including supporting documents of her diagnosis, care notes, and a history and physical form. She has not been following with a neurologist recently.    Her dementia symptoms include paranoia, frequently checking that doors and windows are locked, and being easily startled. She sometimes requires assistance with toileting, using Depends occasionally, but mostly manages to get to the bathroom on her own. She currently resides with her daughter, Rilla.    Her medication regimen includes donepezil  for memory, Trelegy for asthma and COPD, and albuterol  for use with a nebulizer, although she lacks the nebulizer machine. She is also on blood pressure medications, including amlodipine  and Hyzaar , taking a total of five pills for blood pressure management.    She has a history of prediabetes with a last A1c of 5.8 in March, and a recent eye examination revealed mild glaucoma and cataracts. She has not had a sleep study for suspected sleep apnea and does not use a CPAP  machine. She experiences occasional headaches and has a history of a small brain mass that was not removed.    Her mood is generally well, but she expresses fear and anxiety about her health condition, stating 'I don't like being sick like this.' No recent falls.            I have reviewed the problem list, medications, and allergies and have updated/reconciled them if needed.    Regina Vargas  reports that she has quit smoking. Her smoking use included cigarettes. She has never used smokeless tobacco.  Health Maintenance   Topic Date Due    Medicare Annual Wellness Visit (AWV)  10/18/2013    Zoster Vaccines (3 of 3) 10/22/2019    DTaP/Tdap/Td Vaccines (2 - Td or Tdap) 02/23/2020    COVID-19 Vaccine (3 - 2024-25 season) 11/03/2022    Influenza Vaccine (1) 11/03/2023    Hemoglobin A1c  05/15/2024    Serum Creatinine Monitoring  05/15/2024    Potassium Monitoring  05/15/2024    COPD Spirometry  11/14/2024    Mammogram  03/30/2025    DEXA Scan  07/08/2028    Colon Cancer Screening  10/28/2028    Pneumococcal Vaccine 50+  Completed    Hepatitis C Screen  Completed       Objective     VITALS: BP 152/86 (BP Position: Sitting) Comment: avg bp - Pulse 75  - Temp 36.4 ??C (97.5 ??F) (Temporal)  - Ht 170.2 cm (5' 7.01)  - Wt 94.6 kg (208 lb 9.6 oz)  - LMP  (LMP Unknown)  - SpO2 97%  - BMI 32.66 kg/m??     Physical Exam  Constitutional:       Appearance: Normal appearance. She is normal weight.   HENT:      Head: Normocephalic and atraumatic.      Nose: Nose normal.   Eyes:      General: No scleral icterus.  Pulmonary:      Effort: Pulmonary effort is normal.   Abdominal:      General: There is no distension.   Musculoskeletal:         General: No deformity.      Cervical back: Normal range of motion.   Skin:     General: Skin is warm and dry.   Neurological:      General: No focal deficit present.      Mental Status: She is alert and oriented to person, place, and time.   Psychiatric:         Mood and Affect: Mood normal.         Behavior: Behavior normal.         LABS/IMAGING  I have reviewed pertinent recent labs and imaging in Epic    [facility]  at [facility]  [address] [place], [place] [address]  Telephone [phone]  Fax [phone]  [email or web address]

## 2023-10-07 NOTE — Unmapped (Signed)
 [facility] Refill Coordination Note    Specialty Medication(s) to be Shipped:   CF/Pulmonary/Asthma: Dupixent     Other medication(s) to be shipped: No additional medications requested for fill at this time     Regina Vargas, DOB: 05-15-1950  Phone: [phone] (home)       All above HIPAA information was verified with patient's family member, Daughter.     Was a Nurse, learning disability used for this call? No    Completed refill call assessment today to schedule patient's medication shipment from the [facility]  [phone]).  All relevant notes have been reviewed.     Specialty medication(s) and dose(s) confirmed: Regimen is correct and unchanged.   Changes to medications: Regina Vargas reports no changes at this time.  Changes to insurance: No  New side effects reported not previously addressed with a pharmacist or physician: None reported  Questions for the pharmacist: No    Confirmed patient received a Conservation officer, historic buildings and a Surveyor, mining with first shipment. The patient will receive a drug information handout for each medication shipped and additional FDA Medication Guides as required.       DISEASE/MEDICATION-SPECIFIC INFORMATION        For patients on injectable medications: Patient currently has 1 doses left.  Next injection is scheduled for 08/05.    SPECIALTY MEDICATION ADHERENCE     Medication Adherence    Patient reported X missed doses in the last month: 0  Specialty Medication: DUPIXENT  PEN 300 mg/2 mL pen injector (dupilumab )  Patient is on additional specialty medications: No              Were doses missed due to medication being on hold? No     DUPIXENT  PEN 300 mg/2 mL pen injector (dupilumab ) : 1 doses of medicine on hand     REFERRAL TO PHARMACIST     Referral to the pharmacist: Not needed      SHIPPING     Shipping address confirmed in Epic.     Cost and Payment: Patient has a $0 copay, payment information is not required.    Delivery Scheduled: Yes, Expected medication delivery date: 08/12.     Medication will be delivered via Same Day Courier to the prescription address in Epic WAM.    Regina Vargas   [facility]  Specialty Technician

## 2023-10-14 MED FILL — DUPIXENT 300 MG/2 ML SUBCUTANEOUS PEN INJECTOR: SUBCUTANEOUS | 28 days supply | Qty: 4 | Fill #4

## 2023-10-16 ENCOUNTER — Ambulatory Visit: Admit: 2023-10-16 | Discharge: 2023-10-17 | Payer: Medicare (Managed Care)

## 2023-10-16 NOTE — Unmapped (Signed)
 Patient complaining or pressure in her chest.  She is diaphoretic and has mildly labored breathing.  BP 130/72, HR 79, O2 98% RR 22.  Patient states this has not happened before.    Patient denies nausea, but feels SOB. She is lightheaded.  She does not remember where she is.  She is tearful and is complaining of a HA, throbbing pain.  She is having increased blinking with her vision and per patient, she cannot control it.  Forehead is sensitive to touch.    Patient had an episode of whole body jerking, head bobbing and speaking gibberish for about 5 seconds, but she was aware of what was going on.    Patient states she has eaten today,but did not drink much water today.  Patient given some water.     Offered to call 911, however, patient's daughter does not want patient to go to the ER.       Provider at the patient's side, and performed assessment.     Patient sent home.  VSS: BP 140/72; HR 74; RR 26, O2 99%.  Patient placed in a wheelchair  and discharged home.

## 2023-10-20 NOTE — Unmapped (Signed)
 Patient c/o chest discomfort and headache, acute onset.  Dtr says pt has done this before when she is anxious.  Not interested in calling EMS or taking pt to ED for evaluation.  -VSS  -Inconsistent report of symptoms, including headache, vision changes, and chest pressure.  -Oriented to self and place   Speech fluent, no dysarthria  Insight judged to be impaired   Visual fields full.  Extraocular movements intact with good smooth pursuit.   Vertical and horizontal saccades intact.   Pupils equal and reactive to light.   Facial expression and sensation: intact  Hearing: grossly intact bilaterally  Shoulder shrug equal  Tongue and palate movements:  within normal limits.    No tongue atrophy or fasciculations.    Involuntary movements absent.  No tremor noted  Motor tone: WNL   Normal strength in the upper and lower extremities.     Reports inconsistent chest pain with chest palpation    Asked dtr to take pt home. Advised to take to ED or call 911 if s/s continues    Video visit scheduled for next week

## 2023-10-29 ENCOUNTER — Ambulatory Visit: Admit: 2023-10-29 | Payer: Medicare (Managed Care)

## 2023-10-29 NOTE — Unmapped (Incomplete)
 You were seen today by Richrd Dine, NP at the [facility]    Below is a summary of our recommendations    Medications changes today:        Recommendations made today:        Please reach out to us  if you have any questions or concerns by using Mychart message or by calling our clinic at [phone] option #3

## 2023-10-29 NOTE — Unmapped (Unsigned)
 [facility]   [address]  [place], [place] [address]  COGNITIVE-BEHAVIORAL NEUROLOGY      Date: October 29, 2023   Patient Name: Regina Vargas   MRN: [id number]   PCP: Isaiah Lynwood Simpers  Referring Provider: Isaiah Lynwood Simpers, *    [phone] (home)   [phone] (mobile)  [email or web address]  Assessment / Plan :     Diagnosis of Moderate late onset Alzheimer's dementia w/depression     Summary by Dr Drucilla  -8 year history of gradual cognitive decline, with first mention of memory loss in 2016. At this time, it is clear that she has a neurodegenerative disease. 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 I will obtain both a skin biopsy for alpha-synuclein and csf for beta-amyloid and tau.     CSF +AD   Skin biopsy negative   MRI brain 03/2021 Chronic microvascular ischemic changes.     Update 10/29/2023  -   Plan/recommendations:        There are no diagnoses linked to this encounter.      No follow-ups on file.  Subjective:     I had the pleasure of seeing Gillie Brault virtually for a follow up visit in the company of ***, who was essential to providing a complete HPI.    HPI:    Since last visit :    Recent ED visit/hospitalization/significant events:  -      Cognitive-  Per pt:  -    Per family:  -    Language/speech-    Motor-  Falls-    Mood/behavior-  Enjoys  Hallucinations-   Paranoia-     Sleep-    Vision:  Hearing:    Bowel/bladder:    Etoh     Physical activity:    Diet/appetite:  - Reports that appetite is   Denies dysphagia, coughing/choking with eating or drinking     Wt Readings from Last 3 Encounters:   10/16/23 94.8 kg (209 lb)   10/03/23 94.6 kg (208 lb 9.6 oz)   09/22/23 93.9 kg (207 lb)       Physical activities  Etoh intake Non smoker    Social:  Lives with   Support system includes     Patient's concern:  1.    Family's concern:  1.    Independent with Instrumental Activities of Daily Living (iADLs) Yes   No   Driving []    []      Finances []    []      Medications []    []       []    []           Independent with Activities of Daily Living (ADLs) Yes   No   Bathing []    []      Toileting []    []      Personal Hygiene []    []      Dressing []    []      Feeding []    []        Current medications (including OTC) with possible cognitive impact:  -lorazepam  ???    Cognitive Medication Management History:  Donepezil  (Aricept ) - 10 mg daily   Escitalopram  (Lexapro ) - 10 mg daily     Previous medications   -     Objective:  has a past medical history of Asthma (HHS-HCC), Colon polyps (03/04/2010), History of anxiety, and Hypertension.    Lab Results   Component Value Date    LDL 133 (H) 05/16/2023       Lab Results   Component Value Date    A1C 5.8 (H) 05/16/2023       TSH   Date Value Ref Range Status   12/07/2020 1.631 0.550 - 4.780 uIU/mL Final     Vitamin B-12   Date Value Ref Range Status   12/07/2020 707 211 - 911 pg/ml Final     RPR   Date Value Ref Range Status   12/07/2020 Nonreactive Nonreactive Final     Ferritin   Date Value Ref Range Status   02/12/2021 148.2 7.3 - 270.7 ng/mL Final       Medical history, surgical history, social history, family history, allergies and medications have been reviewed and updated in chart encounter.      All assessment done virtually, with assistance from caregiver/partner as needed    Exam-    Laboratory testing:  TSH   Date Value Ref Range Status   12/07/2020 1.631 0.550 - 4.780 uIU/mL Final     Vitamin B-12   Date Value Ref Range Status   12/07/2020 707 211 - 911 pg/ml Final     RPR   Date Value Ref Range Status   12/07/2020 Nonreactive Nonreactive Final       Imaging:  Results for orders placed or performed during the hospital encounter of 03/27/22   CT Head Wo Contrast    Narrative    EXAM: Computed tomography, head or brain without contrast material.  DATE: 03/27/2022 4:10 PM  ACCESSION: [id number]  DICTATED: 03/27/2022 4:11 PM  INTERPRETATION LOCATION: [facility]    CLINICAL INDICATION: 73 years old Female with acute worsening of headache that has been ongoing for 1 month, dizziness, blurred vision ; Headache, new or worsening (Age >= 50y)      COMPARISON: Brain MRI 04/04/2021    TECHNIQUE: Axial CT images of the head  from skull base to vertex without contrast.    FINDINGS:  There is no midline shift.  There is no evidence of large territory subacute infarct. No acute intracranial hemorrhage.     No fractures are evident. Hyperostosis frontalis. The sinuses are pneumatized. Carotid siphon calcifications. Calcifications along the falx. There are scattered hypodense foci within the periventricular and deep white matter.  These are nonspecific but commonly associated with small vessel ischemic changes.      Impression    No evidence of acute intracranial pathology.   Results for orders placed or performed during the hospital encounter of 04/04/21   MRI brain with and without contrast    Narrative    EXAM: Magnetic resonance imaging, brain, without and with contrast material.  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    TECHNIQUE: Multiplanar, multisequence MR imaging of the brain was performed without and with I.V. contrast.    FINDINGS:    There are scattered and confluent  foci of signal abnormality within the periventricular and deep white matter.  These are nonspecific  but commonly seen with small vessel ischemic changes. Ventricles are normal in size. There is no midline shift. No extra-axial fluid collection. No evidence of intracranial hemorrhage. No diffusion weighted signal abnormality to suggest acute infarct.    In the left anterior margin of the sella, there is a relatively hypoenhancing lesion measuring approximately 0.7 x 0.7 x 0.6 cm (24:113) probably represents a pituitary microadenoma, new since prior.      Impression    Chronic microvascular ischemic changes.    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             Telehealth Visit Attestation:     The patient reports they are physically located in [place]  and is currently: at home.  I spent ** minutes on the video call, pre and post visit activities on the date of service.

## 2023-11-10 NOTE — Unmapped (Signed)
 [facility] has made a second and final attempt to reach this patient to refill the following medication:Dupixent .      We have left voicemails on the following phone numbers: [phone] and have sent a text message to the following phone numbers: [phone].    Dates contacted: 09/05 09/08  Last scheduled delivery: 08/12    The patient may be at risk of non-compliance with this medication. The patient should call the [facility] at [phone]  Option 4, then Option 3: Allergy, Immunology, Pulmonary, Neurology to refill medication.    Jocie Meroney   [facility]

## 2023-11-14 ENCOUNTER — Ambulatory Visit: Admit: 2023-11-14 | Payer: Medicare (Managed Care)

## 2024-02-19 NOTE — Progress Notes (Signed)
 Specialty Medication(s): Dupixent     Ms.Mori has been dis-enrolled from the [facility] specialty pharmacy services as a result of multiple unsuccessful outreach attempts by the pharmacy.    Additional information provided to the patient: n/a    Shelba DELENA Hummer, PharmD  [facility] Specialty Pharmacist
